# Patient Record
Sex: Female | Born: 1960 | Race: White | Hispanic: No | Marital: Married | Smoking: Never smoker
Health system: Southern US, Community
[De-identification: ages and names within clinical notes are randomized; demographics above are authoritative.]

## PROBLEM LIST (undated history)

## (undated) DIAGNOSIS — G259 Extrapyramidal and movement disorder, unspecified: Secondary | ICD-10-CM

## (undated) DIAGNOSIS — H539 Unspecified visual disturbance: Secondary | ICD-10-CM

## (undated) DIAGNOSIS — E785 Hyperlipidemia, unspecified: Secondary | ICD-10-CM

## (undated) DIAGNOSIS — F329 Major depressive disorder, single episode, unspecified: Secondary | ICD-10-CM

## (undated) DIAGNOSIS — F32A Depression, unspecified: Secondary | ICD-10-CM

## (undated) DIAGNOSIS — G35 Multiple sclerosis: Secondary | ICD-10-CM

## (undated) HISTORY — DX: Unspecified visual disturbance: H53.9

## (undated) HISTORY — PX: ABDOMINAL HYSTERECTOMY: SHX81

## (undated) HISTORY — DX: Extrapyramidal and movement disorder, unspecified: G25.9

---

## 2000-11-09 ENCOUNTER — Inpatient Hospital Stay (HOSPITAL_COMMUNITY): Admission: AD | Admit: 2000-11-09 | Discharge: 2000-11-09 | Payer: Self-pay | Admitting: Obstetrics & Gynecology

## 2010-11-08 DIAGNOSIS — N393 Stress incontinence (female) (male): Secondary | ICD-10-CM | POA: Insufficient documentation

## 2012-11-03 ENCOUNTER — Emergency Department (HOSPITAL_BASED_OUTPATIENT_CLINIC_OR_DEPARTMENT_OTHER)
Admission: EM | Admit: 2012-11-03 | Discharge: 2012-11-03 | Disposition: A | Discharge status: Home / Self Care | Payer: Managed Care, Other (non HMO) | Attending: Emergency Medicine | Admitting: Emergency Medicine

## 2012-11-03 ENCOUNTER — Encounter (HOSPITAL_BASED_OUTPATIENT_CLINIC_OR_DEPARTMENT_OTHER): Payer: Self-pay | Admitting: *Deleted

## 2012-11-03 ENCOUNTER — Emergency Department (HOSPITAL_BASED_OUTPATIENT_CLINIC_OR_DEPARTMENT_OTHER): Payer: Managed Care, Other (non HMO)

## 2012-11-03 DIAGNOSIS — Y929 Unspecified place or not applicable: Secondary | ICD-10-CM | POA: Insufficient documentation

## 2012-11-03 DIAGNOSIS — S5290XA Unspecified fracture of unspecified forearm, initial encounter for closed fracture: Secondary | ICD-10-CM

## 2012-11-03 DIAGNOSIS — G35 Multiple sclerosis: Secondary | ICD-10-CM | POA: Insufficient documentation

## 2012-11-03 DIAGNOSIS — E785 Hyperlipidemia, unspecified: Secondary | ICD-10-CM | POA: Insufficient documentation

## 2012-11-03 DIAGNOSIS — Y9301 Activity, walking, marching and hiking: Secondary | ICD-10-CM | POA: Insufficient documentation

## 2012-11-03 DIAGNOSIS — F3289 Other specified depressive episodes: Secondary | ICD-10-CM | POA: Insufficient documentation

## 2012-11-03 DIAGNOSIS — Z79899 Other long term (current) drug therapy: Secondary | ICD-10-CM | POA: Insufficient documentation

## 2012-11-03 DIAGNOSIS — F329 Major depressive disorder, single episode, unspecified: Secondary | ICD-10-CM | POA: Insufficient documentation

## 2012-11-03 DIAGNOSIS — W010XXA Fall on same level from slipping, tripping and stumbling without subsequent striking against object, initial encounter: Secondary | ICD-10-CM | POA: Insufficient documentation

## 2012-11-03 HISTORY — DX: Major depressive disorder, single episode, unspecified: F32.9

## 2012-11-03 HISTORY — DX: Hyperlipidemia, unspecified: E78.5

## 2012-11-03 HISTORY — DX: Multiple sclerosis: G35

## 2012-11-03 HISTORY — DX: Depression, unspecified: F32.A

## 2012-11-03 MED ORDER — HYDROCODONE-ACETAMINOPHEN 5-325 MG PO TABS: 2.0000 | ORAL_TABLET | ORAL | Status: DC | PRN

## 2012-11-03 NOTE — ED Notes (Signed)
Pt c/o right wrist injury x 3 days ago from fall

## 2012-11-03 NOTE — ED Provider Notes (Signed)
History     CSN: [id number]  Arrival date & time 11/03/12  1323   First MD Initiated Contact with Patient 11/03/12 1422      Chief Complaint  Patient presents with  . Wrist Pain    (Consider location/radiation/quality/duration/timing/severity/associated sxs/prior treatment) Patient is a 53 y.o. female presenting with wrist pain. The history is provided by the patient. No language interpreter was used.  Wrist Pain This is a new problem. Episode onset: 3 days ago. The problem occurs constantly. The problem has been unchanged. Associated symptoms include joint swelling and myalgias. Nothing aggravates the symptoms. She has tried nothing for the symptoms. The treatment provided no relief.  Pt fell 3 days ago and injured wrist.  Pt complains of swelling and pain to wrist and hand  Past Medical History  Diagnosis Date  . MS (multiple sclerosis)   . Depressed   . Hyperlipemia     Past Surgical History  Procedure Laterality Date  . Abdominal hysterectomy      History reviewed. No pertinent family history.  History  Substance Use Topics  . Smoking status: Never Smoker   . Smokeless tobacco: Not on file  . Alcohol Use: No    OB History   Grav Para Term Preterm Abortions TAB SAB Ect Mult Living                  Review of Systems  Musculoskeletal: Positive for myalgias and joint swelling.  All other systems reviewed and are negative.    Allergies  Latex  Home Medications   Current Outpatient Rx  Name  Route  Sig  Dispense  Refill  . FLUoxetine (PROZAC) 40 MG capsule   Oral   Take 40 mg by mouth daily.         Marland Kitchen lovastatin (MEVACOR) 10 MG tablet   Oral   Take 10 mg by mouth at bedtime.         Marland Kitchen oxybutynin (DITROPAN) 5 MG tablet   Oral   Take 5 mg by mouth 3 (three) times daily.           BP 105/74  Pulse 88  Temp(Src) 98.1 F (36.7 C) (Oral)  Resp 16  Ht 5' 7.5" (1.715 m)  Wt 145 lb (65.772 kg)  BMI 22.36 kg/m2  SpO2 100%  Physical Exam   Nursing note and vitals reviewed. Constitutional: She appears well-developed and well-nourished.  HENT:  Head: Normocephalic and atraumatic.  Musculoskeletal: She exhibits tenderness.  Swollen bruised right hand and wrist  Neurological: She is alert.  Skin: Skin is warm.  Psychiatric: She has a normal mood and affect.    ED Course  Procedures (including critical care time)  Labs Reviewed - No data to display Dg Wrist Complete Right  11/03/2012  *RADIOLOGY REPORT*  Clinical Data: Larey Seat.  Pain and swelling.  RIGHT WRIST - COMPLETE 3+ VIEW  Comparison: None.  Findings: There is a fracture of the radial metaphyseal region. Distal radial articular surface shows neutral tilt.  No fracture of the ulna.  No carpal fractures seen.  IMPRESSION: Transverse fracture of the distal radial metaphysis.  No pronounced angulation or any displacement.  Neutral tilt of the distal radial articular surface.   Original Report Authenticated By: Paulina Fusi, M.D.    Dg Hand Complete Right  11/03/2012  *RADIOLOGY REPORT*  Clinical Data: Fall.  Pain.  RIGHT HAND - COMPLETE 3+ VIEW  Comparison: None.  Findings: No fracture of the phalanges or, metacarpals or carpal  bones. There is a chronic appearing calcification adjacent to the metacarpal phalangeal joint of the long finger.  See wrist study for description of distal radial metaphyseal fracture.  IMPRESSION: No fracture in the hand.  Chronic appearing calcification at the MCP joint of the long finger.  Distal radial metaphyseal fracture.   Original Report Authenticated By: Paulina Fusi, M.D.      1. Fracture, radius     Pt counseled on fracture.   Pt advised to see Dr. Pearletha Forge this week for recheck.  MDM  Pt placed in a wrist splint.   rx for hydrocodone        Lonia Skinner [place], [place] 11/03/12 1454  Lonia Skinner [place], [place] 11/03/12 1456  Lonia Skinner [place], [place] 11/03/12 1505

## 2012-11-07 NOTE — ED Provider Notes (Signed)
Medical screening examination/treatment/procedure(s) were performed by non-physician practitioner and as supervising physician I was immediately available for consultation/collaboration.  Hurman Horn, MD 11/07/12 [phone]

## 2012-11-12 NOTE — ED Notes (Signed)
Adjusted ace wrap on sugar tong splint applied by me  on previous visit due to clothing had pulled a peace of ace off. Good cp refill. Advised pt to ware loose wrist shirts.

## 2013-02-28 DIAGNOSIS — E7800 Pure hypercholesterolemia, unspecified: Secondary | ICD-10-CM | POA: Insufficient documentation

## 2013-02-28 DIAGNOSIS — E039 Hypothyroidism, unspecified: Secondary | ICD-10-CM | POA: Insufficient documentation

## 2013-02-28 DIAGNOSIS — R42 Dizziness and giddiness: Secondary | ICD-10-CM | POA: Insufficient documentation

## 2013-02-28 DIAGNOSIS — F329 Major depressive disorder, single episode, unspecified: Secondary | ICD-10-CM | POA: Insufficient documentation

## 2013-02-28 DIAGNOSIS — R269 Unspecified abnormalities of gait and mobility: Secondary | ICD-10-CM | POA: Insufficient documentation

## 2013-06-10 DIAGNOSIS — G35 Multiple sclerosis: Secondary | ICD-10-CM | POA: Insufficient documentation

## 2013-06-10 DIAGNOSIS — F99 Mental disorder, not otherwise specified: Secondary | ICD-10-CM | POA: Insufficient documentation

## 2014-05-07 IMAGING — CR DG HAND COMPLETE 3+V*R*
3 series · 3 of 3 positions shown · non-contrast
Comparison: None.

CLINICAL DATA: Fall.  Pain.

RIGHT HAND - COMPLETE 3+ VIEW

[x hand pa right]
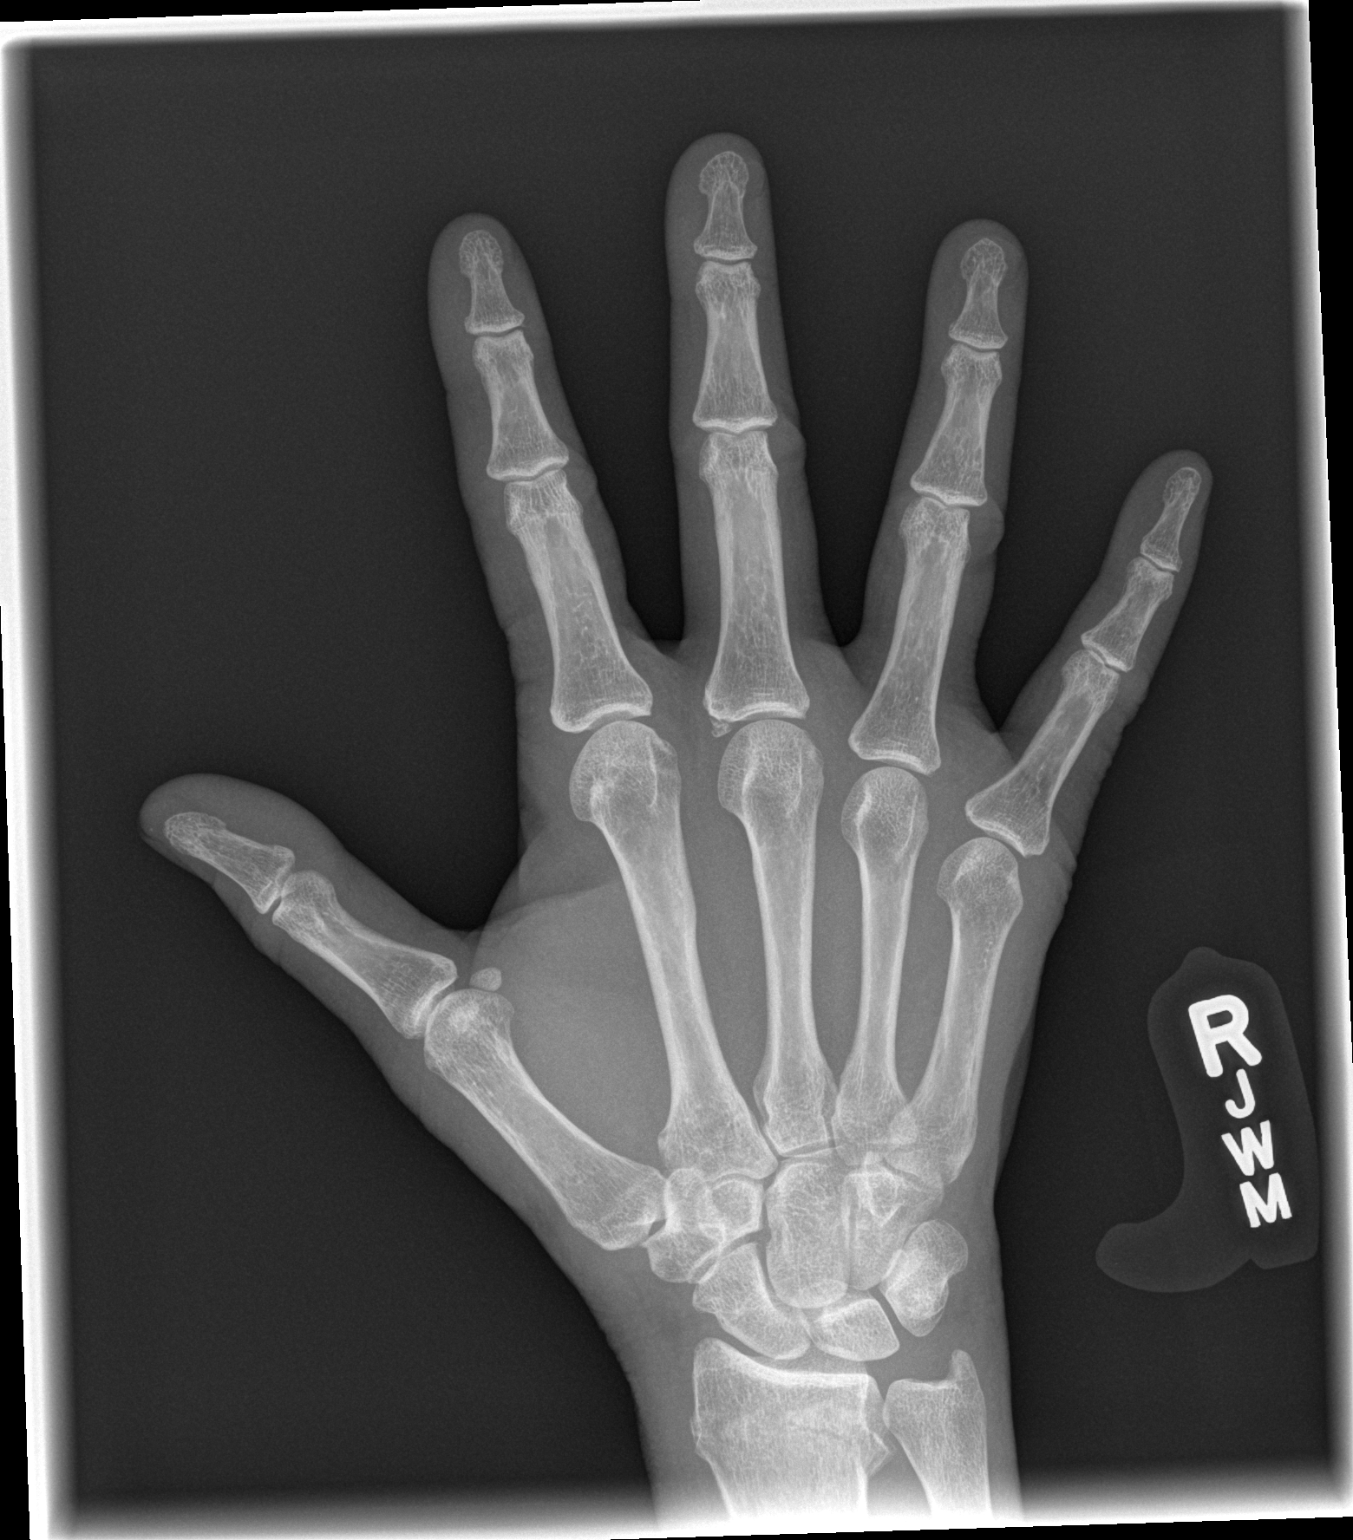

[x hand oblique right]
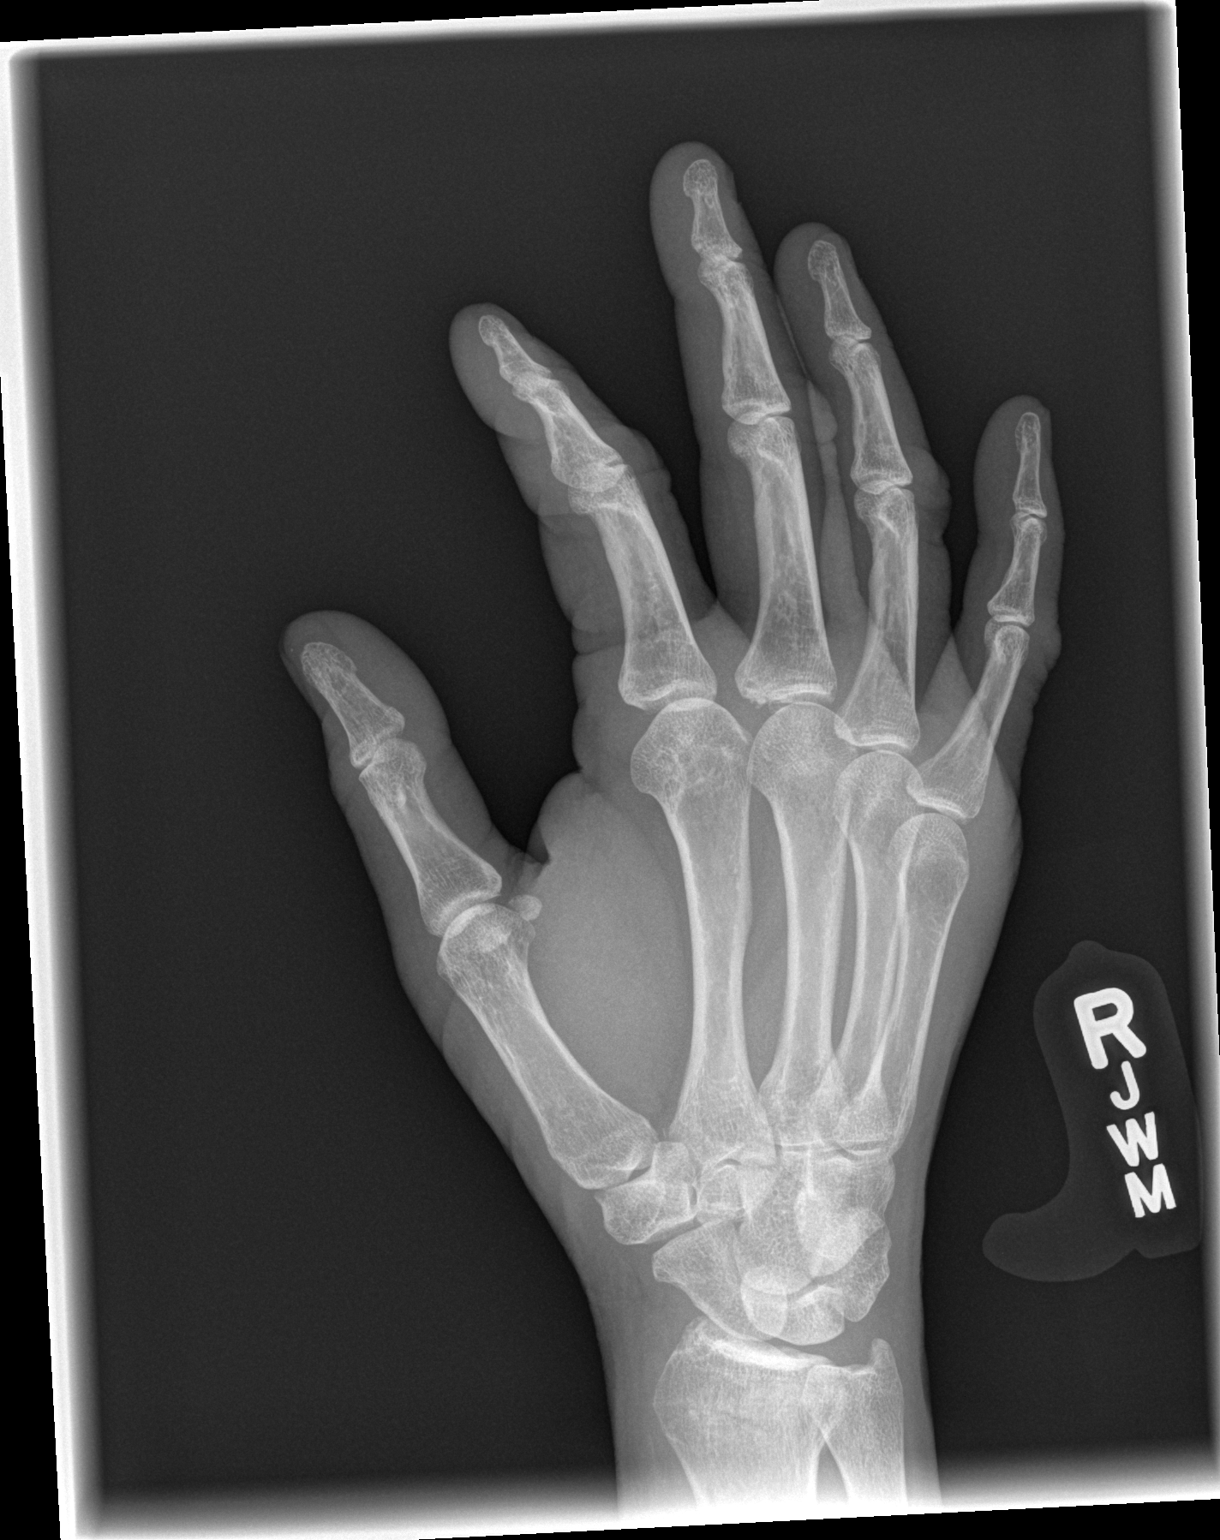

[x hand lat right]
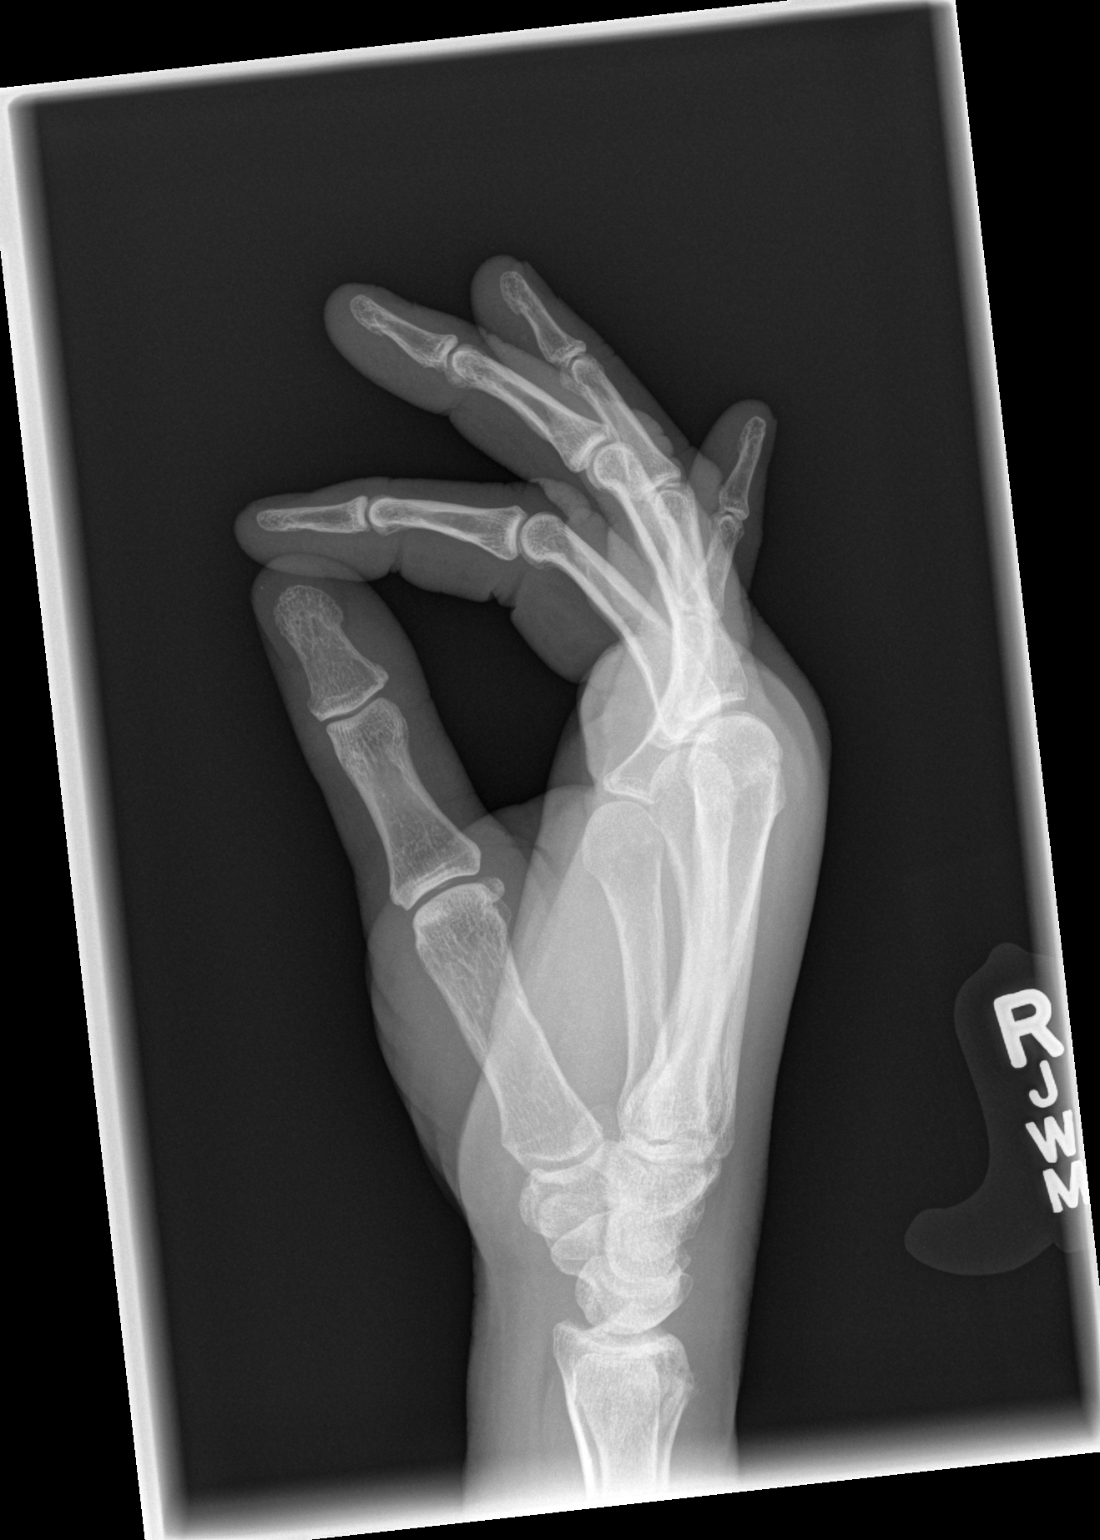

[3 of 3 positions shown; findings below may reference images not displayed]

FINDINGS: No fracture of the phalanges or, metacarpals or carpal
bones. There is a chronic appearing calcification adjacent to the
metacarpal phalangeal joint of the long finger.  See wrist study
for description of distal radial metaphyseal fracture.
IMPRESSION: No fracture in the hand.  Chronic appearing calcification at the
MCP joint of the long finger.  Distal radial metaphyseal fracture.

## 2014-06-10 DIAGNOSIS — Z1211 Encounter for screening for malignant neoplasm of colon: Secondary | ICD-10-CM | POA: Insufficient documentation

## 2014-06-10 DIAGNOSIS — N3942 Incontinence without sensory awareness: Secondary | ICD-10-CM | POA: Insufficient documentation

## 2014-11-11 ENCOUNTER — Telehealth: Payer: Self-pay | Admitting: Neurology

## 2014-11-11 NOTE — Telephone Encounter (Signed)
Noted.  Will discuss at ov appt/fim

## 2014-11-11 NOTE — Telephone Encounter (Signed)
Patient is calling to advise that her psychiatrist wrote her a Rx for Ritalin 20mg  but patient is cutting pill in half and only taking 10mg  which is working good for her and she is able to concentrate. Patient does have an appointment with Dr. Felecia Shelling on 3-31.

## 2014-12-07 ENCOUNTER — Telehealth: Payer: Self-pay | Admitting: Neurology

## 2014-12-07 NOTE — Telephone Encounter (Signed)
I checked with Faith, and it appears unfortunately, since the patient has not yet been seen at [facility], we do not have access to her medical records.  This clinical info is required to process PA.  I called [employer] back.  Spoke with Marylyn Ishihara.  He said they did have a previous auth on file that will be expiring. He will note info in patient's account that we called.  Says they do have another note on the account that they spoke with the patient, and may contact other office for info if needed prior to patient being seen here.

## 2014-12-07 NOTE — Telephone Encounter (Signed)
Dawn Keith with [facility] @ [phone], stated PA is needed for Rx GILENYA.  Patient scheduled to see Dr. Felecia Shelling on 12/23/14, may call in verbal PA to [phone].  Please call and advise.

## 2014-12-23 ENCOUNTER — Ambulatory Visit (INDEPENDENT_AMBULATORY_CARE_PROVIDER_SITE_OTHER): Payer: Managed Care, Other (non HMO) | Admitting: Neurology

## 2014-12-23 ENCOUNTER — Other Ambulatory Visit: Payer: Self-pay | Admitting: *Deleted

## 2014-12-23 ENCOUNTER — Encounter: Payer: Self-pay | Admitting: Neurology

## 2014-12-23 VITALS — BP 102/82 | HR 80 | Resp 14 | Ht 67.5 in | Wt 129.4 lb

## 2014-12-23 DIAGNOSIS — R269 Unspecified abnormalities of gait and mobility: Secondary | ICD-10-CM

## 2014-12-23 DIAGNOSIS — G4700 Insomnia, unspecified: Secondary | ICD-10-CM | POA: Diagnosis not present

## 2014-12-23 DIAGNOSIS — F329 Major depressive disorder, single episode, unspecified: Secondary | ICD-10-CM | POA: Diagnosis not present

## 2014-12-23 DIAGNOSIS — N393 Stress incontinence (female) (male): Secondary | ICD-10-CM | POA: Diagnosis not present

## 2014-12-23 DIAGNOSIS — R5383 Other fatigue: Secondary | ICD-10-CM | POA: Insufficient documentation

## 2014-12-23 DIAGNOSIS — F482 Pseudobulbar affect: Secondary | ICD-10-CM | POA: Insufficient documentation

## 2014-12-23 DIAGNOSIS — G35 Multiple sclerosis: Secondary | ICD-10-CM | POA: Diagnosis not present

## 2014-12-23 DIAGNOSIS — F32A Depression, unspecified: Secondary | ICD-10-CM

## 2014-12-23 MED ORDER — METHYLPHENIDATE HCL 20 MG PO TABS: ORAL_TABLET | ORAL | Status: DC

## 2014-12-23 MED ORDER — OXYBUTYNIN CHLORIDE 5 MG PO TABS: 5.0000 mg | ORAL_TABLET | Freq: Two times a day (BID) | ORAL | Status: DC

## 2014-12-23 MED ORDER — CITALOPRAM HYDROBROMIDE 40 MG PO TABS: 40.0000 mg | ORAL_TABLET | Freq: Every day | ORAL | Status: DC

## 2014-12-23 MED ORDER — FINGOLIMOD HCL 0.5 MG PO CAPS: 0.5000 mg | ORAL_CAPSULE | Freq: Every day | ORAL | Status: DC

## 2014-12-23 NOTE — Progress Notes (Signed)
[facility]  PATIENT: Dawn Keith DOB: 1961-03-01  REFERRING DOCTOR OR PCP:  None  SOURCE: patient  _________________________________   HISTORICAL  CHIEF COMPLAINT:  Chief Complaint  Patient presents with  . Multiple Sclerosis    Sts. she tolerates Gilenya well.  She is tearful today, sts. under more fiancial stress./fim    HISTORY OF PRESENT ILLNESS:  Dawn Keith is a 54 yo woman who was diagnosed with MS in 2012 after several years of worsening gait.      She started on Gilenya and has tolerated it well and has had no defiinite exacerbation but has worsened.   Her last MRI was last yeaer at [facility] in [facility].      Gait/Strength/Sensation:   She has difficulty with her gait and stumbles a lot but rarely falls.   Her right side seems weaker to her than the left.   She has no definite numbness or painful tingling.  Bladder:  She has frequency and urgency with incontinence daily.   She drips a lot and also has frank episodes of incontinece.   Poise pads have helped.   There is no hesitancy and she thinks she empties her bladder well. There have not been recent urinary tract infections.  Vision:   She reports no new visual symptoms.     She is scheduled to see ophthalmology soon for her annual exam.  Fatigue/sleep:   She has cognitive and physical fatigue.   Ritalin has helped and she is on 10 mg in the morning and 10 mg at noon.   She reports insomnia with difficulty falling asleep and staying asleep.   She notes that if she wakes up after a few hours of sleep that she may not be on fall back asleep again.  Mood/Cognition:   Mood issues have worsened for her. She has depression and is often tearful.    Nuedexta was recently started she thinks it helped her some. She stopped fluoxetine because a friend told her that should her dose (60 mg) was too high. I discussed with her that that dose is not that unusual but she prefers to use a different  medication..  She remains on buspirone.    She thinks she gets a benefit from it at bid.    She is tearful multiple times a the visit today and is very stressed with her illness and financial issues.      REVIEW OF SYSTEMS: Constitutional: No fevers, chills, sweats, or change in appetite.  Notes fatigue Eyes: No visual changes, double vision, eye pain Ear, nose and throat: No hearing loss, ear pain, nasal congestion, sore throat Cardiovascular: No chest pain, palpitations Respiratory: No shortness of breath at rest or with exertion.   No wheezes GastrointestinaI: No nausea, vomiting, diarrhea, abdominal pain, fecal incontinence Genitourinary: as above Musculoskeletal: No neck pain, back pain Integumentary: No rash, pruritus, skin lesions Neurological: as above Psychiatric: as aove Endocrine: No palpitations, diaphoresis, change in appetite, change in weigh or increased thirst Hematologic/Lymphatic: No anemia, purpura, petechiae. Allergic/Immunologic: No itchy/runny eyes, nasal congestion, recent allergic reactions, rashes  ALLERGIES: Allergies  Allergen Reactions  . Donepezil     "makes me mean"  . Latex     HOME MEDICATIONS:  Current outpatient prescriptions:  .  busPIRone (BUSPAR) 15 MG tablet, Take 15 mg by mouth., Disp: , Rfl:  .  Cholecalciferol 5000 UNITS TABS, Take by mouth., Disp: , Rfl:  .  DOCOSAHEXAENOIC ACID PO, Take by  mouth., Disp: , Rfl:  .  Fingolimod HCl 0.5 MG CAPS, Take 0.5 mg by mouth daily., Disp: , Rfl:  .  FLUoxetine (PROZAC) 40 MG capsule, Take 40 mg by mouth daily., Disp: , Rfl:  .  HYDROcodone-acetaminophen (NORCO/VICODIN) 5-325 MG per tablet, Take 2 tablets by mouth every 4 (four) hours as needed for pain., Disp: 20 tablet, Rfl: 0 .  lamoTRIgine (LAMICTAL) 25 MG tablet, , Disp: , Rfl:  .  levothyroxine (SYNTHROID, LEVOTHROID) 75 MCG tablet, , Disp: , Rfl:  .  lovastatin (MEVACOR) 10 MG tablet, Take 10 mg by mouth at bedtime., Disp: , Rfl:  .   methylphenidate (RITALIN) 20 MG tablet, Take 20 mg by mouth. Take one tablet in the morning and 1/2 tablet at noon, Disp: , Rfl:  .  Naproxen Sodium 220 MG CAPS, Take by mouth., Disp: , Rfl:  .  NUEDEXTA 20-10 MG CAPS, Take 20 mg by mouth 2 (two) times daily., Disp: , Rfl:  .  oxybutynin (DITROPAN) 5 MG tablet, Take 5 mg by mouth 3 (three) times daily., Disp: , Rfl:  .  solifenacin (VESICARE) 10 MG tablet, Take 10 mg by mouth., Disp: , Rfl:  .  vitamin B-12 (CYANOCOBALAMIN) 1000 MCG tablet, Take by mouth., Disp: , Rfl:   PAST MEDICAL HISTORY: Past Medical History  Diagnosis Date  . MS (multiple sclerosis)   . Depressed   . Hyperlipemia   . Movement disorder   . Vision abnormalities     PAST SURGICAL HISTORY: Past Surgical History  Procedure Laterality Date  . Abdominal hysterectomy      FAMILY HISTORY: History reviewed. No pertinent family history.  SOCIAL HISTORY:  History   Social History  . Marital Status: Married    Spouse Name: N/A  . Number of Children: N/A  . Years of Education: N/A   Occupational History  . Not on file.   Social History Main Topics  . Smoking status: Never Smoker   . Smokeless tobacco: Not on file  . Alcohol Use: No  . Drug Use: No  . Sexual Activity: No   Other Topics Concern  . Not on file   Social History Narrative     PHYSICAL EXAM  Filed Vitals:   12/23/14 1316  BP: 102/82  Pulse: 80  Resp: 14  Height: 5' 7.5" (1.715 m)  Weight: 129 lb 6.4 oz (58.695 kg)    Body mass index is 19.96 kg/(m^2).   General: The patient is well-developed and well-nourished and in no acute distress  Eyes:  Funduscopic exam shows normal optic discs and retinal vessels.  Neck: The neck is supple, no carotid bruits are noted.  The neck is nontender.  Cardiovascular: The heart has a regular rate and rhythm with a normal S1 and S2. There were no murmurs, gallops or rubs. Lungs are clear to auscultation.  Skin: Extremities are without  significant edema.  Musculoskeletal:  Back is nontender  Neurologic Exam  Mental status: The patient is alert and oriented x 3 at the time of the examination. There is some flight of ideas and she needs constant redirection. She is tearful.   Memory and attention appear to be normal and baseline for her.    Speech is normal.  Cranial nerves: Extraocular movements are full. Pupils are equal, round, and reactive to light and accomodation.  Visual fields are full.  Facial symmetry is present. There is good facial sensation to soft touch bilaterally.Facial strength is normal.  Trapezius and sternocleidomastoid  strength is normal. No dysarthria is noted.  The tongue is midline, and the patient has symmetric elevation of the soft palate. No obvious hearing deficits are noted.  Motor:  Muscle bulk is normal.   Tone is slightly increased in legs.. Strength is  5 / 5 in all 4 extremities.   Sensory: Sensory testing is intact to pinprick, soft touch and vibration sensation in all 4 extremities.  Coordination: Cerebellar testing reveals good finger-nose-finger and heel-to-shin bilaterally.  Gait and station: Station is normal.   Gait is wide. Tandem gait is poor. Romberg is negative.   Reflexes: Deep tendon reflexes are increased on both legs.   Plantar responses are flexor.    DIAGNOSTIC DATA (LABS, IMAGING, TESTING) - I reviewed patient records, labs, notes, testing and imaging myself where available.     ASSESSMENT AND PLAN  DS (disseminated sclerosis)  Clinical depression  Abnormal gait  Female genuine stress incontinence  Pseudobulbar affect  Other fatigue  Insomnia   In summary, Dawn Keith is a 54 year old woman with multiple sclerosis who has many neurologic symptoms including poor gait, bladder dysfunction, depression was pseudobulbar affect her mood disturbances appear to be the most bothersome to her at this point. She is very tearful and I discussed with her that I think  she needs to get back on an antidepressant. She stopped fluoxetine because a friend told her that her dose was too high. I think she would benefit from an SSRI and a placed on citalopram.   The concern about QT prolongation and interaction with Gilenya is not a concern after the first couple weeks of being on Gilenya. She is very concerned about money and I'm trying to make sure that she is placed on a $4 drug. I also printed out a coupon to that she can get the oxybutynin for discount.   I gave her some samples of Nuedexta and a savings card.   I refilled her Ritalin. I recommend that she continue to follow-up with psychiatry as she does appear to be doing worse than she was doing last year. Her MS is fairly stable and she has not had any definite exacerbation while on Gilenya. She tolerates it well. We will probably check another MRI later this year to rule out subclinical progression.  She will return to see me in 4 months or sooner if she has new or worsening neurologic symptoms.  45 minute face-to-face interaction with greater than one half of the time counseling and coordinating care about her MS and psychiatric issues.   Richard A. Felecia Shelling, MD, PhD 02/26/5408, 8:11 PM Certified in Neurology, Clinical Neurophysiology, Sleep Medicine, Pain Medicine and Neuroimaging  [facility] [address] [place], [place] [address] [phone]

## 2014-12-27 ENCOUNTER — Telehealth: Payer: Self-pay

## 2014-12-27 ENCOUNTER — Telehealth: Payer: Self-pay | Admitting: Neurology

## 2014-12-27 NOTE — Telephone Encounter (Signed)
Patient is calling to let you know the new Rx Nuedexta 20-10mg  is doing really well for her and to let you know she appreciates you! Thanks!

## 2014-12-27 NOTE — Telephone Encounter (Signed)
Dawn Keith has approved the request for coverage on Gilenya effective until 12/26/2015 Ref # [id number]

## 2014-12-27 NOTE — Telephone Encounter (Signed)
Noted/fim 

## 2014-12-28 ENCOUNTER — Telehealth: Payer: Self-pay | Admitting: *Deleted

## 2014-12-28 NOTE — Telephone Encounter (Signed)
Release fax to [facility] on 12/28/14.

## 2014-12-29 ENCOUNTER — Telehealth: Payer: Self-pay | Admitting: Neurology

## 2014-12-29 NOTE — Telephone Encounter (Signed)
Spoke with Snigdha and advised RAS has signed handicap placard application and I have mailed this to her home address today. She verbalized understanding of same/fim

## 2014-12-29 NOTE — Telephone Encounter (Signed)
Patient stated she needs 1 additional [place].  Please call and advise.

## 2015-02-22 ENCOUNTER — Telehealth: Payer: Self-pay | Admitting: Neurology

## 2015-02-22 NOTE — Telephone Encounter (Signed)
Patient called and requested to speak with Faith RN regarding questions she as about Rx. methylphenidate (RITALIN) 20 MG tablet. Please call and advise.

## 2015-02-22 NOTE — Telephone Encounter (Signed)
I have spoken with Dawn Keith this afternoon.  She simply wanted to review her medications and what they are for.  We reviewd that Buspar and Celexa are for depression, Nuedexta is to help with emotional outbursts, Ritalin is for fatigue.  She also asked about Donepezil.  We discussed that she has taken this in the past for memory loss, but that it didn't seem to help her very much; that she seems to get more benefit from Ritalin, antidepressants, and Nuedexta.  Camil verbalized understanding of same/fim

## 2015-04-06 ENCOUNTER — Telehealth: Payer: Self-pay | Admitting: Neurology

## 2015-04-06 NOTE — Telephone Encounter (Signed)
I have spoken with Dawn Keith this morning and per her request rescheduled her 7-18 appt to September, due to copay and trnsportation issues. I also answered med questions regarding Nuedexta./fim

## 2015-04-06 NOTE — Telephone Encounter (Signed)
Patient called and requested to speak with Faith RN regarding her up coming apt and some questions she has about her medication. Please call and advise.

## 2015-04-11 ENCOUNTER — Ambulatory Visit: Payer: Managed Care, Other (non HMO) | Admitting: Neurology

## 2015-06-07 ENCOUNTER — Ambulatory Visit (INDEPENDENT_AMBULATORY_CARE_PROVIDER_SITE_OTHER): Payer: Managed Care, Other (non HMO) | Admitting: Neurology

## 2015-06-07 ENCOUNTER — Encounter: Payer: Self-pay | Admitting: Neurology

## 2015-06-07 VITALS — BP 126/76 | HR 70 | Resp 16 | Ht 67.5 in | Wt 127.2 lb

## 2015-06-07 DIAGNOSIS — F482 Pseudobulbar affect: Secondary | ICD-10-CM | POA: Diagnosis not present

## 2015-06-07 DIAGNOSIS — G4700 Insomnia, unspecified: Secondary | ICD-10-CM

## 2015-06-07 DIAGNOSIS — F09 Unspecified mental disorder due to known physiological condition: Secondary | ICD-10-CM

## 2015-06-07 DIAGNOSIS — R5383 Other fatigue: Secondary | ICD-10-CM

## 2015-06-07 DIAGNOSIS — F99 Mental disorder, not otherwise specified: Secondary | ICD-10-CM

## 2015-06-07 DIAGNOSIS — F329 Major depressive disorder, single episode, unspecified: Secondary | ICD-10-CM

## 2015-06-07 DIAGNOSIS — G35 Multiple sclerosis: Secondary | ICD-10-CM | POA: Diagnosis not present

## 2015-06-07 DIAGNOSIS — N3942 Incontinence without sensory awareness: Secondary | ICD-10-CM | POA: Diagnosis not present

## 2015-06-07 DIAGNOSIS — R269 Unspecified abnormalities of gait and mobility: Secondary | ICD-10-CM | POA: Diagnosis not present

## 2015-06-07 DIAGNOSIS — F32A Depression, unspecified: Secondary | ICD-10-CM

## 2015-06-07 MED ORDER — METHYLPHENIDATE HCL 20 MG PO TABS: ORAL_TABLET | ORAL | Status: DC

## 2015-06-07 MED ORDER — B-12 2500 MCG PO TABS: 2500.0000 ug | ORAL_TABLET | Freq: Every day | ORAL | Status: AC

## 2015-06-07 MED ORDER — CITALOPRAM HYDROBROMIDE 40 MG PO TABS: 40.0000 mg | ORAL_TABLET | Freq: Every day | ORAL | Status: DC

## 2015-06-07 MED ORDER — BUSPIRONE HCL 15 MG PO TABS: 15.0000 mg | ORAL_TABLET | Freq: Two times a day (BID) | ORAL | Status: DC

## 2015-06-07 MED ORDER — NUEDEXTA 20-10 MG PO CAPS: 20.0000 mg | ORAL_CAPSULE | Freq: Two times a day (BID) | ORAL | Status: DC

## 2015-06-07 NOTE — Patient Instructions (Signed)
Call the [facility] to find out more about Medicare [phone]

## 2015-06-07 NOTE — Progress Notes (Signed)
[facility]  PATIENT: Dawn Keith DOB: [birth year]  REFERRING DOCTOR OR PCP:  None  SOURCE: patient  _________________________________   HISTORICAL  CHIEF COMPLAINT:  Chief Complaint  Patient presents with  . Multiple Sclerosis    Sts. she continues to tolerate Gilenya well.  At last ov, Citalopram was added for depression.  She is out of this, is not sure if it helped or not. Sts. she recently saw her psychiatrist, that no changes were made to her tx regimen, and she has a f/u app. with psych in 3 mos.  Sts. she is not taking Nudexta and Buspar for financial reasons./fim    HISTORY OF PRESENT ILLNESS:  Dawn Keith is a 54 yo woman with MS who has had worsening gait and a lot more difficulty with mood over the past year.  She is on Gilenya and tolerates it well.   She has not had definite exacerbations but feels she is doing worse.     Due to copays, she has trouble seeing Psychiatry.     MS history:  She was diagnosed with MS in 2012 after several years of worsening gait.      She started on Gilenya and has tolerated it well and has had no defiinite exacerbation but has worsened.   Her last MRI was last year at [facility] in [facility].      Gait/Strength/Sensation:   She has difficulty with her gait.  She stumbles a lot but rarely falls.   Her right side i  weaker to her than the left.   She has no definite numbness or painful tingling.   She feels weaker in hot weather.   She was able to climb up to do her job [employer]or, [employer]) last year but now can't do this.    Bladder:  She has frequency and urgency with incontinence daily.   She drips a lot and also has frank episodes of incontinece.   Poise pads have helped.   There is no hesitancy and she thinks she empties her bladder well. There have not been recent urinary tract infections.  Vision:   She reports no new visual symptoms.     She is scheduled to see ophthalmology soon for her annual  exam.  Fatigue/sleep:   She has cognitive and physical fatigue.   Ritalin has helped and she is on 10 mg in the morning and 10 mg at noon.   She has had insomnia with difficulty falling asleep and staying asleep.  She worries a lot about the next day every night.    If she wakes up after a few hours of sleep that she may not be on fall back asleep again.  Mood/Cognition:   Mood issues have worsened a lot over the last year and she is tearful and stressed.   She has depression and is often tearful.  Celexa may have helped   Nuedexta was recently started she thinks it helped her some.   She thinks she got a benefit from celexa.    She is tearful multiple times a the visit today and is very stressed with her illness and financial issues.      REVIEW OF SYSTEMS: Constitutional: No fevers, chills, sweats, or change in appetite.  Notes fatigue Eyes: No visual changes, double vision, eye pain Ear, nose and throat: No hearing loss, ear pain, nasal congestion, sore throat Cardiovascular: No chest pain, palpitations Respiratory: No shortness of breath at rest or with exertion.  No wheezes GastrointestinaI: No nausea, vomiting, diarrhea, abdominal pain, fecal incontinence Genitourinary: as above Musculoskeletal: No neck pain, back pain Integumentary: No rash, pruritus, skin lesions Neurological: as above Psychiatric: as aove Endocrine: No palpitations, diaphoresis, change in appetite, change in weigh or increased thirst Hematologic/Lymphatic: No anemia, purpura, petechiae. Allergic/Immunologic: No itchy/runny eyes, nasal congestion, recent allergic reactions, rashes  ALLERGIES: Allergies  Allergen Reactions  . Donepezil     "makes me mean"  . Latex     HOME MEDICATIONS:  Current outpatient prescriptions:  .  Cholecalciferol 5000 UNITS TABS, Take by mouth., Disp: , Rfl:  .  Fingolimod HCl 0.5 MG CAPS, Take 1 capsule (0.5 mg total) by mouth daily., Disp: 30 capsule, Rfl: 11 .  lamoTRIgine  (LAMICTAL) 25 MG tablet, , Disp: , Rfl:  .  levothyroxine (SYNTHROID, LEVOTHROID) 75 MCG tablet, , Disp: , Rfl:  .  lovastatin (MEVACOR) 10 MG tablet, Take 10 mg by mouth at bedtime., Disp: , Rfl:  .  methylphenidate (RITALIN) 20 MG tablet, Take one tablet in the morning and 1/2 tablet at noon, Disp: 45 tablet, Rfl: 0 .  oxybutynin (DITROPAN) 5 MG tablet, Take 1 tablet (5 mg total) by mouth 2 (two) times daily., Disp: 60 tablet, Rfl: 11 .  busPIRone (BUSPAR) 15 MG tablet, Take 15 mg by mouth., Disp: , Rfl:  .  citalopram (CELEXA) 40 MG tablet, Take 1 tablet (40 mg total) by mouth daily. (Patient not taking: Reported on 06/07/2015), Disp: 30 tablet, Rfl: 11 .  DOCOSAHEXAENOIC ACID PO, Take by mouth., Disp: , Rfl:  .  HYDROcodone-acetaminophen (NORCO/VICODIN) 5-325 MG per tablet, Take 2 tablets by mouth every 4 (four) hours as needed for pain. (Patient not taking: Reported on 06/07/2015), Disp: 20 tablet, Rfl: 0 .  Naproxen Sodium 220 MG CAPS, Take by mouth., Disp: , Rfl:  .  NUEDEXTA 20-10 MG CAPS, Take 20 mg by mouth 2 (two) times daily., Disp: , Rfl:  .  vitamin B-12 (CYANOCOBALAMIN) 1000 MCG tablet, Take by mouth., Disp: , Rfl:   PAST MEDICAL HISTORY: Past Medical History  Diagnosis Date  . MS (multiple sclerosis)   . Depressed   . Hyperlipemia   . Movement disorder   . Vision abnormalities     PAST SURGICAL HISTORY: Past Surgical History  Procedure Laterality Date  . Abdominal hysterectomy      FAMILY HISTORY: History reviewed. No pertinent family history.  SOCIAL HISTORY:  Social History   Social History  . Marital Status: Married    Spouse Name: N/A  . Number of Children: N/A  . Years of Education: N/A   Occupational History  . Not on file.   Social History Main Topics  . Smoking status: Never Smoker   . Smokeless tobacco: Not on file  . Alcohol Use: No  . Drug Use: No  . Sexual Activity: No   Other Topics Concern  . Not on file   Social History Narrative      PHYSICAL EXAM  Filed Vitals:   06/07/15 1527  BP: 126/76  Pulse: 70  Resp: 16  Height: 5' 7.5" (1.715 m)  Weight: 127 lb 3.2 oz (57.698 kg)    Body mass index is 19.62 kg/(m^2).   General: The patient is well-developed and well-nourished and in no acute distress.     Eyes:  Funduscopic exam shows normal optic discs and retinal vessels.  Neck: The neck is supple, no carotid bruits are noted.  The neck is nontender.  Cardiovascular: The  heart has a regular rate and rhythm with a normal S1 and S2. There were no murmurs, gallops or rubs. Lungs are clear to auscultation.  Skin: Extremities are without significant edema.  Musculoskeletal:  Back is nontender  Neurologic Exam  Mental status: The patient is alert and oriented x 3 at the time of the examination. She cried on several occasions and also struck out at her mom a few times and had pressured speech at times. She was very distractible   There is some flight of ideas and she needs constant redirection.   Memory and attention appear to be normal and baseline for her.    Speech is normal.  Cranial nerves: Extraocular movements are full.    Facial symmetry is present. There is good facial sensation to soft touch bilaterally.Facial strength is normal.  Trapezius and sternocleidomastoid strength is normal. No dysarthria is noted.    No obvious hearing deficits are noted.  Motor:  Muscle bulk is normal.   Tone is slightly increased in legs.. Strength is  5 / 5 in all 4 extremities.   Sensory: Sensory testing is intact to soft touch and vibration sensation in all 4 extremities.  Coordination: Cerebellar testing reveals good finger-nose-finger and reduced heel-to-shin in right leg.  Gait and station: Station is normal.   Gait is wide but she can walk without her cane. She can't tandem walk. Romberg is negative.   Reflexes: Deep tendon reflexes are increased on both legs.   Plantar responses are flexor.    DIAGNOSTIC DATA  (LABS, IMAGING, TESTING) - I reviewed patient records, labs, notes, testing and imaging myself where available.     ASSESSMENT AND PLAN  DS (disseminated sclerosis)  Organic brain syndrome, psychotic  Abnormal gait  Clinical depression  Other fatigue  Insomnia  Pseudobulbar affect  Urinary incontinence without sensory awareness   1.   I refilled her buspirone and citalopram and Nuedexta. I asked her to please let us know if she runs out in the future so she does not go without medication. I strongly recommended that she follow-up with psychiatry due to her severe agitated depression.    2.   I reviewed in detail all of her medications so that she knew what to take and when. 3.   Methylphenidate was renewed. 4.   She is advised to stay active and to use her cane or walker to help prevent falls. 5.   Return in 5 months or sooner if there are new or worsening neurologic symptoms.  45 minute face-to-face interaction with greater than one half of the time counseling and coordinating care about her MS and psychiatric issues.   Mcdonald Reiling A. Felecia Shelling, MD, PhD 5/80/9983, 3:82 PM Certified in Neurology, Clinical Neurophysiology, Sleep Medicine, Pain Medicine and Neuroimaging  [facility] [address], [place], [place] [address] [phone]

## 2015-06-09 ENCOUNTER — Ambulatory Visit: Payer: Self-pay | Admitting: Neurology

## 2015-07-14 ENCOUNTER — Telehealth: Payer: Self-pay | Admitting: Neurology

## 2015-07-14 NOTE — Telephone Encounter (Signed)
Patient is calling. The patient has questions about medications citalopram (CELEXA) 40 MG tablet and busPIRone (BUSPAR) 15 MG tablet. Please call and discuss. Thank you.

## 2015-07-14 NOTE — Telephone Encounter (Signed)
I have spoken with Dawn Keith, provided reassurance and answered questions about meds.  (She just wanted to know what Celexa and Buspar are for and I advised depression/anxiety).Hilton Cork

## 2015-07-25 ENCOUNTER — Telehealth: Payer: Self-pay | Admitting: Neurology

## 2015-07-25 ENCOUNTER — Other Ambulatory Visit: Payer: Self-pay | Admitting: Neurology

## 2015-07-25 MED ORDER — METHYLPHENIDATE HCL 20 MG PO TABS: ORAL_TABLET | ORAL | Status: DC

## 2015-07-25 NOTE — Telephone Encounter (Signed)
Patient called requesting for Faith to call back regarding methylphenidate (RITALIN) 20 MG tablet. Husband is at pharmacy now and Rx has expired. They need paper Rx. Call patient [phone].

## 2015-07-25 NOTE — Telephone Encounter (Signed)
Per RAS, ok for 2 postdated rx's.  Rx's printed, signed, up front [facility].  I have spoken with Dawn Keith and made her aware rx's are ready to be picked up/fim

## 2015-07-25 NOTE — Telephone Encounter (Signed)
Patient called requesting that Rx for methylphenidate (RITALIN) 20 MG tablet go ahead and be written for November and December so she doesn't have to go through what she went through this time.

## 2015-07-25 NOTE — Telephone Encounter (Signed)
Pt called sts she needs refill formethylphenidate (RITALIN) 20 MG tablet ,she began to cry and said she is a prisoner in her own home. She said her husband controls everything, she has no car, no gas, if someone calls he hears the phone. She sts he controls their daughter also. She sts cannot call while he's at home, he will hear and question her. (documentation only)

## 2015-07-25 NOTE — Telephone Encounter (Signed)
I have spoken with Dawn Keith this afternoon, and advised that Ritalin rx. has been printed, signed, is up front [facility] for her to pick up.  She verbalized understanding of same/fim

## 2015-11-01 ENCOUNTER — Telehealth: Payer: Self-pay | Admitting: Neurology

## 2015-11-01 MED ORDER — NUEDEXTA 20-10 MG PO CAPS: 20.0000 mg | ORAL_CAPSULE | Freq: Two times a day (BID) | ORAL | Status: DC

## 2015-11-01 NOTE — Telephone Encounter (Signed)
error 

## 2015-11-01 NOTE — Telephone Encounter (Signed)
Pt called requesting refill for NUEDEXTA 20-10 MG CAPS to [facility] on [address]

## 2015-11-08 ENCOUNTER — Telehealth: Payer: Self-pay | Admitting: Neurology

## 2015-11-08 ENCOUNTER — Ambulatory Visit (INDEPENDENT_AMBULATORY_CARE_PROVIDER_SITE_OTHER): Payer: Managed Care, Other (non HMO) | Admitting: Neurology

## 2015-11-08 ENCOUNTER — Encounter: Payer: Self-pay | Admitting: Neurology

## 2015-11-08 VITALS — BP 116/68 | HR 72 | Resp 16 | Ht 67.5 in | Wt 125.0 lb

## 2015-11-08 DIAGNOSIS — F09 Unspecified mental disorder due to known physiological condition: Secondary | ICD-10-CM

## 2015-11-08 DIAGNOSIS — R269 Unspecified abnormalities of gait and mobility: Secondary | ICD-10-CM

## 2015-11-08 DIAGNOSIS — F99 Mental disorder, not otherwise specified: Secondary | ICD-10-CM

## 2015-11-08 DIAGNOSIS — F482 Pseudobulbar affect: Secondary | ICD-10-CM | POA: Diagnosis not present

## 2015-11-08 DIAGNOSIS — F32A Depression, unspecified: Secondary | ICD-10-CM

## 2015-11-08 DIAGNOSIS — G35 Multiple sclerosis: Secondary | ICD-10-CM | POA: Diagnosis not present

## 2015-11-08 DIAGNOSIS — F329 Major depressive disorder, single episode, unspecified: Secondary | ICD-10-CM

## 2015-11-08 DIAGNOSIS — G4700 Insomnia, unspecified: Secondary | ICD-10-CM

## 2015-11-08 DIAGNOSIS — R5383 Other fatigue: Secondary | ICD-10-CM

## 2015-11-08 MED ORDER — METHYLPHENIDATE HCL 20 MG PO TABS: ORAL_TABLET | ORAL | Status: DC

## 2015-11-08 NOTE — Telephone Encounter (Signed)
Pt called she forgot to ask about RX for Gilenya. She takes this everyday. She is inquiring if it needs PA.

## 2015-11-08 NOTE — Progress Notes (Signed)
[facility]  PATIENT: Dawn Keith DOB: 1961-03-13  REFERRING DOCTOR OR PCP:  None  SOURCE: patient  _________________________________   HISTORICAL  CHIEF COMPLAINT:  Chief Complaint  Patient presents with  . Multiple Sclerosis    Sts. she continues to tolerate Gilenya well.  Denies missed doses.  Sts. she is taking Nuedexta and believes this helps.  She continues to c/o cognitive disabilities and the negative effect they have on her family life--relationship with her mother, husband and dtr/fim    HISTORY OF PRESENT ILLNESS:  Dawn Keith is a 55 yo woman with MS.    She feels her MS has been mostly stable but she gets frustrated easily.     She is on Gilenya and tolerates it well.   She has not had definite exacerbations but continues to feel poorly.    Gait/Strength/Sensation:   She has difficulty with her gait.  She stumbles a lot and sometimes falls.  She has had a couple falls that led to injuries.     Her right side is  weaker than the left.   She has no definite numbness or painful tingling.   She feels weaker in hot weather.   She was able to climb a ladder (though not well) in 2015.    Bladder:  She has frequency and urgency and was having incontinence.    Oxybutynin was started and has helped.   There is no hesitancy and she thinks she empties her bladder well. There have not been recent urinary tract infections.  Vision:   She reports no new visual symptoms.     She is scheduled to see ophthalmology soon for her annual exam.  Fatigue/sleep:   She has cognitive and physical fatigue.   Ritalin has helped.  She is on 10 mg in the morning and 10 mg at noon.   She has had insomnia with difficulty falling asleep and staying asleep.  She worries a lot about the next day and family issues.    If she wakes up after a few hours of sleep that she may not be on fall back asleep again.  Mood/Cognition:   Mood issues have worsened a lot over the last year and she is  tearful and stressed.   She has depression and is often tearful.  Celexa may have helped   Nuedexta was recently started she thinks it helped her some.   She thinks she got a benefit from celexa.    She is tearful multiple times a the visit today and is very stressed with her illness and financial issues.      MS history:  She was diagnosed with MS in 2012 after several years of worsening gait.      She started on Gilenya and has tolerated it well and has had no defiinite exacerbation but has worsened.   Her last MRI was 2015 at [facility].        REVIEW OF SYSTEMS: Constitutional: No fevers, chills, sweats, or change in appetite.  Notes fatigue Eyes: No visual changes, double vision, eye pain Ear, nose and throat: No hearing loss, ear pain, nasal congestion, sore throat Cardiovascular: No chest pain, palpitations Respiratory: No shortness of breath at rest or with exertion.   No wheezes GastrointestinaI: No nausea, vomiting, diarrhea, abdominal pain, fecal incontinence Genitourinary: as above Musculoskeletal: No neck pain, back pain Integumentary: No rash, pruritus, skin lesions Neurological: as above Psychiatric: as aove Endocrine: No palpitations, diaphoresis,  change in appetite, change in weigh or increased thirst Hematologic/Lymphatic: No anemia, purpura, petechiae. Allergic/Immunologic: No itchy/runny eyes, nasal congestion, recent allergic reactions, rashes  ALLERGIES: Allergies  Allergen Reactions  . Donepezil     "makes me mean"  . Latex     HOME MEDICATIONS:  Current outpatient prescriptions:  .  busPIRone (BUSPAR) 15 MG tablet, Take 1 tablet (15 mg total) by mouth 2 (two) times daily., Disp: 60 tablet, Rfl: 11 .  Cholecalciferol 5000 UNITS TABS, Take by mouth., Disp: , Rfl:  .  citalopram (CELEXA) 40 MG tablet, Take 1 tablet (40 mg total) by mouth daily., Disp: 30 tablet, Rfl: 11 .  Cyanocobalamin (VITAMIN B-12) 2500 MCG TABS, Take 2,500 mcg  by mouth daily., Disp: 30 tablet, Rfl: 5 .  DOCOSAHEXAENOIC ACID PO, Take by mouth., Disp: , Rfl:  .  Fingolimod HCl 0.5 MG CAPS, Take 1 capsule (0.5 mg total) by mouth daily., Disp: 30 capsule, Rfl: 11 .  levothyroxine (SYNTHROID, LEVOTHROID) 75 MCG tablet, , Disp: , Rfl:  .  lovastatin (MEVACOR) 10 MG tablet, Take 10 mg by mouth at bedtime., Disp: , Rfl:  .  methylphenidate (RITALIN) 20 MG tablet, Take one tablet in the morning and 1/2 tablet at noon, Disp: 45 tablet, Rfl: 0 .  Naproxen Sodium 220 MG CAPS, Take by mouth., Disp: , Rfl:  .  NUEDEXTA 20-10 MG CAPS, Take 20 mg by mouth 2 (two) times daily., Disp: 60 capsule, Rfl: 3 .  oxybutynin (DITROPAN) 5 MG tablet, Take 1 tablet (5 mg total) by mouth 2 (two) times daily., Disp: 60 tablet, Rfl: 11  PAST MEDICAL HISTORY: Past Medical History  Diagnosis Date  . MS (multiple sclerosis) ([place])   . Depressed   . Hyperlipemia   . Movement disorder   . Vision abnormalities     PAST SURGICAL HISTORY: Past Surgical History  Procedure Laterality Date  . Abdominal hysterectomy      FAMILY HISTORY: History reviewed. No pertinent family history.  SOCIAL HISTORY:  Social History   Social History  . Marital Status: Married    Spouse Name: N/A  . Number of Children: N/A  . Years of Education: N/A   Occupational History  . Not on file.   Social History Main Topics  . Smoking status: Never Smoker   . Smokeless tobacco: Not on file  . Alcohol Use: No  . Drug Use: No  . Sexual Activity: No   Other Topics Concern  . Not on file   Social History Narrative     PHYSICAL EXAM  Filed Vitals:   11/08/15 1521  BP: 116/68  Pulse: 72  Resp: 16  Height: 5' 7.5" (1.715 m)  Weight: 125 lb (56.7 kg)    Body mass index is 19.28 kg/(m^2).   General: The patient is well-developed and well-nourished and in no acute distress.     Eyes:  Funduscopic exam shows normal optic discs and retinal vessels.  Neck: The neck is supple, no  carotid bruits are noted.  The neck is nontender.  Cardiovascular: The heart has a regular rate and rhythm with a normal S1 and S2. There were no murmurs, gallops or rubs. Lungs are clear to auscultation.  Skin: Extremities are without significant edema.  Musculoskeletal:  Back is nontender  Neurologic Exam  Mental status: The patient is alert and oriented x 3 at the time of the examination. She did not have any crying spells and did not yell at her mother during this  visit as she did at the last visit.. She is distractible   There is some flight of ideas and she needs constant redirection.   Memory and attention are reduced but are baseline for her.    Speech is normal.  Cranial nerves: Extraocular movements are full.    Facial symmetry is present. There is good facial sensation to soft touch bilaterally.Facial strength is normal.  Trapezius and sternocleidomastoid strength is normal. No dysarthria is noted.    No obvious hearing deficits are noted.  Motor:  Muscle bulk is normal.   Tone is slightly increased in legs.. Strength is  5 / 5 in all 4 extremities.   Sensory: Sensory testing is intact to soft touch and vibration sensation in all 4 extremities.  Coordination: Cerebellar testing reveals good finger-nose-finger and reduced heel-to-shin in right leg.  Gait and station: Station is normal.   Gait is wide but she can walk without her cane. She can't tandem walk. Romberg is negative.   Reflexes: Deep tendon reflexes are increased on both legs.   Plantar responses are flexor.    DIAGNOSTIC DATA (LABS, IMAGING, TESTING) - I reviewed patient records, labs, notes, testing and imaging myself where available.     ASSESSMENT AND PLAN  Multiple sclerosis (HCC)  Abnormal gait  Clinical depression  Other fatigue  Pseudobulbar affect  Insomnia  Organic brain syndrome, psychotic   1.   Continue buspirone and citalopram and Nuedexta. Mood appears better than her last visit.    I recommended that she follow-up with psychiatry due to her agitated depression and poor ability to handle stress.    2.  Methylphenidate was renewed. 3.   Continue Gilenya. Around the time of her next visit, I will consider checking an MRI of the brain to make sure that she is not having subclinical progression. If this is occurring, we will need to consider another disease modifying therapy. 4.   She is advised to stay active and to use her cane or walker to help prevent falls. 5.   Return in 5 months or sooner if there are new or worsening neurologic symptoms.   Roshanda Balazs A. Felecia Shelling, MD, PhD [id number], [id number] PM Certified in Neurology, Clinical Neurophysiology, Sleep Medicine, Pain Medicine and Neuroimaging  [facility] [address] [place], [place] [address] [phone]

## 2015-11-09 NOTE — Telephone Encounter (Signed)
I have spoken with Dawn Keith and advised when pa is needed, her pharmacy will let me know/fim

## 2016-01-04 ENCOUNTER — Other Ambulatory Visit: Payer: Self-pay | Admitting: Neurology

## 2016-01-09 ENCOUNTER — Telehealth: Payer: Self-pay | Admitting: Neurology

## 2016-01-09 MED ORDER — NUEDEXTA 20-10 MG PO CAPS: 20.0000 mg | ORAL_CAPSULE | Freq: Two times a day (BID) | ORAL | Status: DC

## 2016-01-09 MED ORDER — FINGOLIMOD HCL 0.5 MG PO CAPS: 0.5000 mg | ORAL_CAPSULE | Freq: Every day | ORAL | Status: DC

## 2016-01-09 NOTE — Telephone Encounter (Signed)
Gilenya rx. escribed to [facility].  I have spoken with Timmeka and let her know this has been taken care of/fim

## 2016-01-09 NOTE — Telephone Encounter (Signed)
I have spoken with Dawn Keith--Nuedexta escribed to [facility] per her request./fim

## 2016-01-09 NOTE — Telephone Encounter (Signed)
pts insurance has approved medication Gilenya but when she goes to get it filled the pharmacy is telling her the rx has expired . Please call pt to update [phone]

## 2016-01-09 NOTE — Telephone Encounter (Signed)
Patient called to advise, NUEDEXTA 20-10 MG CAPS is about to expire and will need renewal. Patient request that Faith call her [phone].

## 2016-01-13 ENCOUNTER — Telehealth: Payer: Self-pay | Admitting: Diagnostic Neuroimaging

## 2016-01-13 NOTE — Telephone Encounter (Signed)
Noted.  Message printed, in RAS box for review/fim

## 2016-01-13 NOTE — Telephone Encounter (Signed)
Patient husband called in to ask advice. Patient has been getting weaker this week (last 3 days). Having some fevers, generalized pain/moaning, chest pain, overall malaise. Husband mentioned that pt's mother has been dx'd with cancer ans this could be an additional stress factor.   This sounds like possible infectious cause vs MS symptoms. Hopefully not a cardiac or lung (DVT, pneumonia) issue. I advised to go to ER for expedited evaluation. I will forward note to Dr. Felecia Shelling for review as well.  Penni Bombard, MD [id number], [id number] AM Certified in Neurology, Neurophysiology and Neuroimaging  [facility] [address] [place], [place] [address] [phone]

## 2016-01-14 ENCOUNTER — Telehealth: Payer: Self-pay | Admitting: Diagnostic Neuroimaging

## 2016-01-14 NOTE — Telephone Encounter (Signed)
Pt husband called again. Pt admitted to [facility]. They are not giving gilenya to her yet (going through pharmacy verification). Reassured pt husband that hospital is going through proper steps to verify home medication. If pt not able to get gilenya, then husband should contact the hospital attending and discuss issue. i am available to the hospital attending to discuss any concerns.    Penni Bombard, MD [id number], [id number] AM Certified in Neurology, Neurophysiology and Neuroimaging  [facility] [address] [place], [place] [address] [phone]

## 2016-01-16 ENCOUNTER — Telehealth: Payer: Self-pay | Admitting: Neurology

## 2016-01-16 NOTE — Telephone Encounter (Signed)
I have spoken with Dawn Keith this morning and confirmed that he will bring Andrienne in on 01-23-16 at 1120 for  hospital f/u.  She is on the schedule for earlier that day, but I told Bill to ignore time given on robo call, and still be here at 1120.  He verbalized understanding of same/fim

## 2016-01-16 NOTE — Telephone Encounter (Signed)
I spoke to Dawn Keith husband, Rush Landmark,. She is in the hospital with a pneumonia that felt to probably be aspiration pneumonia. They've just been told her swallow study looked good.  I like to see her in the office 01/23/2016 at 11:20 AM for a follow-up

## 2016-01-17 DIAGNOSIS — D72825 Bandemia: Secondary | ICD-10-CM | POA: Insufficient documentation

## 2016-01-17 DIAGNOSIS — E876 Hypokalemia: Secondary | ICD-10-CM | POA: Insufficient documentation

## 2016-01-18 DIAGNOSIS — J189 Pneumonia, unspecified organism: Secondary | ICD-10-CM | POA: Insufficient documentation

## 2016-01-19 NOTE — Telephone Encounter (Signed)
Pt's husband called sts she is bring transferred to Rehab tomorrow but she is expected to be there for 1-2 weeks so she will not be able to come to appt on Monday 5/1. He said the hospital is using her gilenya and she will be running out in the next few days. She needs a refill. Please call him [phone] (c)

## 2016-01-19 NOTE — Telephone Encounter (Signed)
I have spoken with Dawn Keith this morning, advised Gilenya was escribed to  [facility] on 01-09-16.  He should call them at [phone] to schedule delivery/fim

## 2016-01-22 DIAGNOSIS — D473 Essential (hemorrhagic) thrombocythemia: Secondary | ICD-10-CM | POA: Insufficient documentation

## 2016-01-22 DIAGNOSIS — D75839 Thrombocytosis, unspecified: Secondary | ICD-10-CM | POA: Insufficient documentation

## 2016-01-23 ENCOUNTER — Ambulatory Visit: Payer: Self-pay | Admitting: Neurology

## 2016-01-23 DIAGNOSIS — J13 Pneumonia due to Streptococcus pneumoniae: Secondary | ICD-10-CM | POA: Insufficient documentation

## 2016-01-25 DIAGNOSIS — J869 Pyothorax without fistula: Secondary | ICD-10-CM | POA: Insufficient documentation

## 2016-01-25 DIAGNOSIS — J852 Abscess of lung without pneumonia: Secondary | ICD-10-CM | POA: Insufficient documentation

## 2016-01-25 DIAGNOSIS — I950 Idiopathic hypotension: Secondary | ICD-10-CM | POA: Insufficient documentation

## 2016-02-28 ENCOUNTER — Telehealth: Payer: Self-pay | Admitting: Neurology

## 2016-02-28 NOTE — Telephone Encounter (Addendum)
Pt called said Dept of SS said they need a letter of disability. She is unable to schedule and appt today, her husband is out of town, he brings her to appointments but he will be back tomorrow. She will call back to schedule appt.tomorrow  FYI

## 2016-03-15 DIAGNOSIS — E559 Vitamin D deficiency, unspecified: Secondary | ICD-10-CM | POA: Insufficient documentation

## 2016-03-15 DIAGNOSIS — Z1159 Encounter for screening for other viral diseases: Secondary | ICD-10-CM | POA: Insufficient documentation

## 2016-03-19 ENCOUNTER — Telehealth: Payer: Self-pay | Admitting: Neurology

## 2016-03-19 NOTE — Telephone Encounter (Signed)
Pt called request to know the date she was dx with MS. She thought it to be about 9 yrs ago, this would be in [place]. Call was transferred to [facility].

## 2016-03-20 ENCOUNTER — Telehealth: Payer: Self-pay | Admitting: Neurology

## 2016-03-20 ENCOUNTER — Encounter: Payer: Self-pay | Admitting: *Deleted

## 2016-03-20 MED ORDER — FINGOLIMOD HCL 0.5 MG PO CAPS: 0.5000 mg | ORAL_CAPSULE | Freq: Every day | ORAL | Status: DC

## 2016-03-20 NOTE — Telephone Encounter (Signed)
Pt called in to know if Faith could find out the exact date she was diagnosed with MS. She needs to know for the insurance company and disability. Please call and advise

## 2016-03-20 NOTE — Telephone Encounter (Signed)
I have spoken with Dawn Keith this afternoon and per RAS ov note, advised she was dx. with MS in 2012/fim

## 2016-03-20 NOTE — Telephone Encounter (Signed)
Patient called, states [facility] has advised, Fingolimod HCl 0.5 MG CAPS has expired, needs new Rx.

## 2016-03-20 NOTE — Telephone Encounter (Signed)
Gilenya escribed to [facility] Delivery as requested/fim

## 2016-03-22 ENCOUNTER — Other Ambulatory Visit: Payer: Self-pay | Admitting: Neurology

## 2016-03-29 ENCOUNTER — Other Ambulatory Visit: Payer: Self-pay | Admitting: Neurology

## 2016-03-30 NOTE — Telephone Encounter (Signed)
Received request for refill from local pharmacy rather than home delivery pharmacy. Follow-up appt scheduled soon (04/10/16) and can check w/ pt on continuation of mail order at that time. Retailed x 1 month.

## 2016-04-05 ENCOUNTER — Telehealth: Payer: Self-pay | Admitting: Neurology

## 2016-04-05 NOTE — Telephone Encounter (Signed)
I have spoken with [employer] this morning.  She sts. she would like to try and get pt. assistance to help with $50 copays.  I have referred her to [facility] for financial assistance/charity care application/fim

## 2016-04-05 NOTE — Telephone Encounter (Signed)
Patient is calling for a returned call to discuss her upcoming appointment and "other stuff".

## 2016-04-10 ENCOUNTER — Encounter: Payer: Self-pay | Admitting: Neurology

## 2016-04-10 ENCOUNTER — Ambulatory Visit (INDEPENDENT_AMBULATORY_CARE_PROVIDER_SITE_OTHER): Payer: Managed Care, Other (non HMO) | Admitting: Neurology

## 2016-04-10 ENCOUNTER — Ambulatory Visit: Payer: Managed Care, Other (non HMO) | Admitting: Neurology

## 2016-04-10 VITALS — BP 130/64 | HR 82 | Resp 18 | Ht 67.5 in | Wt 120.0 lb

## 2016-04-10 DIAGNOSIS — F32A Depression, unspecified: Secondary | ICD-10-CM

## 2016-04-10 DIAGNOSIS — F329 Major depressive disorder, single episode, unspecified: Secondary | ICD-10-CM | POA: Diagnosis not present

## 2016-04-10 DIAGNOSIS — F482 Pseudobulbar affect: Secondary | ICD-10-CM | POA: Diagnosis not present

## 2016-04-10 DIAGNOSIS — N3942 Incontinence without sensory awareness: Secondary | ICD-10-CM | POA: Diagnosis not present

## 2016-04-10 DIAGNOSIS — G35 Multiple sclerosis: Secondary | ICD-10-CM | POA: Diagnosis not present

## 2016-04-10 DIAGNOSIS — R5383 Other fatigue: Secondary | ICD-10-CM

## 2016-04-10 DIAGNOSIS — R269 Unspecified abnormalities of gait and mobility: Secondary | ICD-10-CM

## 2016-04-10 MED ORDER — METHYLPHENIDATE HCL 20 MG PO TABS: ORAL_TABLET | ORAL | Status: DC

## 2016-04-10 MED ORDER — LAMOTRIGINE 100 MG PO TABS: ORAL_TABLET | ORAL | Status: DC

## 2016-04-10 NOTE — Progress Notes (Signed)
[facility]  PATIENT: Dawn Keith DOB: 1960/12/21  REFERRING DOCTOR OR PCP:  None  SOURCE: patient  _________________________________   HISTORICAL  CHIEF COMPLAINT:  Chief Complaint  Patient presents with  . Multiple Sclerosis    Sts. she continues to tolerate Gilenya well.  She was hospitalized in May for pneumonia. Ambulatory with a cane./fim  . Anxiety    Sts. her psychiatrist started her on Lamotrigine for anxiety/panic attacks.  Sts. she doesn't think she'll be able to afford to continue seeing psych/fim    HISTORY OF PRESENT ILLNESS:  Dawn Keith is a 55 yo woman with MS.    She feels her MS has been mostly stable but she gets frustrated easily.     She is on Gilenya and tolerates it well.   She has not had definite exacerbations but continues to feel poorly.    Pneumonia:   She was hospitalized for pneumonia in May and then did a week of neuro-rehab (Dr. Sandie Ano) afterwards.    She got back to walking with a cane (felt much weaker with pneumonia)  Gait/Strength/Sensation:   She still has difficulty with her gait and stumbles a lot and sometimes falls.  She has had a couple falls that led to injuries.     Her right side is  weaker than the left.   She has no definite numbness or painful tingling.   She feels weaker in hot weather.   She was able to climb a ladder (though not well) in 2015.    Bladder:  She has frequency and urgency and was having incontinence.    Oxybutynin was started and has helped.   There is no hesitancy and she thinks she empties her bladder well. There have not been recent urinary tract infections.  Vision:   She reports no new visual symptoms.     She is scheduled to see ophthalmology soon for her annual exam.  Fatigue/sleep:   She has cognitive and physical fatigue.   Ritalin has helped.  She is on 10 mg in the morning and 5 mg at noon and tolerates it well.   No anorexia or worsening of sleep.      She is sleeping better on current  medications.  She still worries a lot about the next day and family issues.    If she wakes up after a few hours of sleep that she may not be on fall back asleep again.  Mood/Cognition:   Mood issues are better on lamotrigine   She still feels stressed but is having fewer outbursts.  .  Celexa may have helped.  Also her mother recently died (cancer).  Nuedexta helps some as well.     MS history:  She was diagnosed with MS in 2012 after several years of worsening gait.      She started on Gilenya and has tolerated it well and has had no defiinite exacerbation but has worsened.   Her last MRI was 2015 at [place] in [facility].        REVIEW OF SYSTEMS: Constitutional: No fevers, chills, sweats, or change in appetite.  Notes fatigue Eyes: No visual changes, double vision, eye pain Ear, nose and throat: No hearing loss, ear pain, nasal congestion, sore throat Cardiovascular: No chest pain, palpitations Respiratory: No shortness of breath at rest or with exertion.   No wheezes.   Pneumonia in 2017 GastrointestinaI: No nausea, vomiting, diarrhea, abdominal pain, fecal incontinence Genitourinary: as above Musculoskeletal:  No neck pain, back pain Integumentary: No rash, pruritus, skin lesions Neurological: as above Psychiatric: as above Endocrine: No palpitations, diaphoresis, change in appetite, change in weigh or increased thirst Hematologic/Lymphatic: No anemia, purpura, petechiae. Allergic/Immunologic: No itchy/runny eyes, nasal congestion, recent allergic reactions, rashes  ALLERGIES: Allergies  Allergen Reactions  . Donepezil     "makes me mean"  . Latex     HOME MEDICATIONS:  Current outpatient prescriptions:  .  busPIRone (BUSPAR) 15 MG tablet, Take 1 tablet (15 mg total) by mouth 2 (two) times daily., Disp: 60 tablet, Rfl: 11 .  Cholecalciferol 5000 UNITS TABS, Take by mouth., Disp: , Rfl:  .  citalopram (CELEXA) 40 MG tablet, Take 1 tablet (40 mg total) by mouth  daily., Disp: 30 tablet, Rfl: 11 .  citalopram (CELEXA) 40 MG tablet, TAKE ONE TABLET BY MOUTH ONCE DAILY, Disp: 30 tablet, Rfl: 0 .  Cyanocobalamin (VITAMIN B-12) 2500 MCG TABS, Take 2,500 mcg by mouth daily., Disp: 30 tablet, Rfl: 5 .  DOCOSAHEXAENOIC ACID PO, Take by mouth., Disp: , Rfl:  .  Fingolimod HCl 0.5 MG CAPS, Take 1 capsule (0.5 mg total) by mouth daily., Disp: 90 capsule, Rfl: 3 .  lamoTRIgine (LAMICTAL) 25 MG tablet, Take 25 mg by mouth., Disp: , Rfl:  .  levothyroxine (SYNTHROID, LEVOTHROID) 75 MCG tablet, , Disp: , Rfl:  .  lovastatin (MEVACOR) 10 MG tablet, Take 10 mg by mouth at bedtime., Disp: , Rfl:  .  methylphenidate (RITALIN) 20 MG tablet, Take one tablet in the morning and 1/2 tablet at noon, Disp: 45 tablet, Rfl: 0 .  Naproxen Sodium 220 MG CAPS, Take by mouth., Disp: , Rfl:  .  NUEDEXTA 20-10 MG CAPS, Take 20 mg by mouth 2 (two) times daily., Disp: 60 capsule, Rfl: 11 .  oxybutynin (DITROPAN) 5 MG tablet, TAKE ONE TABLET BY MOUTH TWICE DAILY, Disp: 60 tablet, Rfl: 11  PAST MEDICAL HISTORY: Past Medical History  Diagnosis Date  . MS (multiple sclerosis) ([place])   . Depressed   . Hyperlipemia   . Movement disorder   . Vision abnormalities     PAST SURGICAL HISTORY: Past Surgical History  Procedure Laterality Date  . Abdominal hysterectomy      FAMILY HISTORY: History reviewed. No pertinent family history.  SOCIAL HISTORY:  Social History   Social History  . Marital Status: Married    Spouse Name: N/A  . Number of Children: N/A  . Years of Education: N/A   Occupational History  . Not on file.   Social History Main Topics  . Smoking status: Never Smoker   . Smokeless tobacco: Not on file  . Alcohol Use: No  . Drug Use: No  . Sexual Activity: No   Other Topics Concern  . Not on file   Social History Narrative     PHYSICAL EXAM  Filed Vitals:   04/10/16 1100  BP: 130/64  Pulse: 82  Resp: 18  Height: 5' 7.5" (1.715 m)  Weight: 120  lb (54.432 kg)    Body mass index is 18.51 kg/(m^2).   General: The patient is well-developed and well-nourished and in no acute distress.      Skin: Extremities are without rash or significant edema.  Musculoskeletal:  Back is nontender  Neurologic Exam  Mental status: The patient is alert and oriented x 3 at the time of the examination. Less flight of ideas and distractability than last visit.    Memory and attention are reduced but  are baseline for her.    Speech is normal.  Cranial nerves: Extraocular movements are full.    Facial symmetry is present. There is good facial sensation to soft touch bilaterally.Facial strength is normal.  Trapezius and sternocleidomastoid strength is normal. No dysarthria is noted.    No obvious hearing deficits are noted.  Motor:  Muscle bulk is normal.   Tone is slightly increased in legs.. Strength is  5 / 5 in all 4 extremities.   Sensory: Sensory testing is intact to soft touch and vibration sensation in all 4 extremities.  Coordination: Cerebellar testing reveals good finger-nose-finger and reduced heel-to-shin in right leg.  Gait and station: Station is normal.   Gait is wide but she can walk without her cane (better with). She can't tandem walk. Romberg is negative.   Reflexes: Deep tendon reflexes are increased on both legs.      DIAGNOSTIC DATA (LABS, IMAGING, TESTING) - I reviewed patient records, labs, notes, testing and imaging myself where available.     ASSESSMENT AND PLAN  Multiple sclerosis (HCC)  Abnormal gait  Clinical depression  Other fatigue  Pseudobulbar affect   1.   Continue buspirone/citalopram/Nuedexta. Mood appears better on lamotrigine and I can take over scripts.      2.  Methylphenidate was renewed. 3.   Continue Gilenya. She wants to hold off on checking an other brain MRI for subclinical progression due to financial issues.. 4.   She is advised to stay active and to use her cane or walker to help  prevent falls. 5.   Return in 5 months or sooner if there are new or worsening neurologic symptoms.   Richard A. Felecia Shelling, MD, PhD [id number], [id number] AM Certified in Neurology, Clinical Neurophysiology, Sleep Medicine, Pain Medicine and Neuroimaging  [facility] [address] [place], [place] [address] [phone]

## 2016-04-11 ENCOUNTER — Encounter: Payer: Self-pay | Admitting: *Deleted

## 2016-04-11 ENCOUNTER — Telehealth: Payer: Self-pay | Admitting: Neurology

## 2016-04-11 NOTE — Telephone Encounter (Signed)
Patient is calling back stating in the note below the letter needs to state the patient has MS and why she is disabled. Also explain MS and what area of the body it affects.

## 2016-04-11 NOTE — Telephone Encounter (Signed)
Pt needs letter for a disability claim. She did not specify what needs to be stated. Also requesting it be mailed to her home.

## 2016-04-11 NOTE — Telephone Encounter (Signed)
Letter is awaiting RAS sig/fim

## 2016-04-12 NOTE — Telephone Encounter (Signed)
Letter mailed as requested/fim 

## 2016-04-26 ENCOUNTER — Other Ambulatory Visit: Payer: Self-pay | Admitting: Neurology

## 2016-05-01 ENCOUNTER — Telehealth: Payer: Self-pay | Admitting: Neurology

## 2016-05-01 NOTE — Telephone Encounter (Signed)
Extended, pleasant conversation with pt.  She had no specific concern or request, just wanted to talk/fim

## 2016-05-01 NOTE — Telephone Encounter (Signed)
Pt requested Faith to call. She did not want to go into detail

## 2016-06-05 ENCOUNTER — Telehealth: Payer: Self-pay | Admitting: Neurology

## 2016-06-05 MED ORDER — LAMOTRIGINE 100 MG PO TABS: ORAL_TABLET | ORAL | 11 refills | Status: DC

## 2016-06-05 NOTE — Telephone Encounter (Signed)
Lamictal escribed to [facility] as requested/fim 

## 2016-06-05 NOTE — Telephone Encounter (Signed)
Patient called to request refill of LAMOTRIGINE 25 MG to [facility] [place], states she is completely out of this medication.

## 2016-06-14 ENCOUNTER — Telehealth: Payer: Self-pay | Admitting: Neurology

## 2016-06-14 NOTE — Telephone Encounter (Signed)
Left message for Bill to call back/fim

## 2016-06-14 NOTE — Telephone Encounter (Signed)
Bill returned Dawn Keith's call, please call [phone].
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
## 2016-06-14 NOTE — Telephone Encounter (Signed)
LMOM for Dawn Keith to call back/fim

## 2016-06-14 NOTE — Telephone Encounter (Signed)
He sts. he believes she is taking her medications incorrectly. Sts. he tried to pick up r/f of some meds, b/c Dawn Keith told him she was out, but the pharmacist adivsed it was too early to get r/f.  He will take her to [facility] ER for psych eval/fim

## 2016-06-14 NOTE — Telephone Encounter (Signed)
I have spoken with Rush Landmark this afternoon and per RAS, advised that at this point, the best option would be to have a psych eval

## 2016-06-14 NOTE — Telephone Encounter (Signed)
Pt's husband returned RN's call

## 2016-06-20 ENCOUNTER — Telehealth: Payer: Self-pay | Admitting: Neurology

## 2016-06-20 ENCOUNTER — Emergency Department (HOSPITAL_COMMUNITY)
Admission: EM | Admit: 2016-06-20 | Discharge: 2016-06-20 | Disposition: A | Discharge status: Home / Self Care | Payer: Managed Care, Other (non HMO) | Attending: Emergency Medicine | Admitting: Emergency Medicine

## 2016-06-20 DIAGNOSIS — F419 Anxiety disorder, unspecified: Secondary | ICD-10-CM | POA: Diagnosis not present

## 2016-06-20 DIAGNOSIS — E039 Hypothyroidism, unspecified: Secondary | ICD-10-CM | POA: Diagnosis not present

## 2016-06-20 DIAGNOSIS — Z79899 Other long term (current) drug therapy: Secondary | ICD-10-CM | POA: Diagnosis not present

## 2016-06-20 DIAGNOSIS — F600 Paranoid personality disorder: Secondary | ICD-10-CM | POA: Diagnosis present

## 2016-06-20 LAB — COMPREHENSIVE METABOLIC PANEL
ALK PHOS: 48 U/L (ref 38–126)
ALT: 25 U/L (ref 14–54)
AST: 26 U/L (ref 15–41)
Albumin: 4.1 g/dL (ref 3.5–5.0)
Anion gap: 7 (ref 5–15)
BUN: 26 mg/dL — AB (ref 6–20)
CALCIUM: 9.1 mg/dL (ref 8.9–10.3)
CO2: 28 mmol/L (ref 22–32)
CREATININE: 0.84 mg/dL (ref 0.44–1.00)
Chloride: 107 mmol/L (ref 101–111)
Glucose, Bld: 91 mg/dL (ref 65–99)
Potassium: 4.1 mmol/L (ref 3.5–5.1)
Sodium: 142 mmol/L (ref 135–145)
Total Bilirubin: 0.8 mg/dL (ref 0.3–1.2)
Total Protein: 7.2 g/dL (ref 6.5–8.1)

## 2016-06-20 LAB — CBC
HEMATOCRIT: 37.2 % (ref 36.0–46.0)
Hemoglobin: 12.1 g/dL (ref 12.0–15.0)
MCH: 30.3 pg (ref 26.0–34.0)
MCHC: 32.5 g/dL (ref 30.0–36.0)
MCV: 93.2 fL (ref 78.0–100.0)
PLATELETS: 371 10*3/uL (ref 150–400)
RBC: 3.99 MIL/uL (ref 3.87–5.11)
RDW: 14.6 % (ref 11.5–15.5)
WBC: 6.5 10*3/uL (ref 4.0–10.5)

## 2016-06-20 LAB — ETHANOL
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
## 2016-06-20 NOTE — Telephone Encounter (Signed)
Dawn Keith [phone] (friend) called with the pt on the phone also. She said she just spoke with Education officer, museum and was advised to have the pt's provider fill out a FL2 form. She said the pt needs to be placed in an assisted living if she will go.

## 2016-06-20 NOTE — ED Notes (Signed)
Bed: WTR5 Expected date:  Expected time:  Means of arrival:  Comments: 

## 2016-06-20 NOTE — Telephone Encounter (Signed)
Dawn Keith is not on pt's hipaa--I am not able to communicate with  her./fim
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
## 2016-06-28 ENCOUNTER — Telehealth: Payer: Self-pay | Admitting: Neurology

## 2016-06-28 NOTE — Telephone Encounter (Signed)
Patient called requesting to speak with Faith regarding visits w/Dr. Felecia Shelling. Patient asks that Faith call ASAP [phone].

## 2016-07-05 ENCOUNTER — Telehealth: Payer: Self-pay | Admitting: Neurology

## 2016-07-05 MED ORDER — NUEDEXTA 20-10 MG PO CAPS
20.0000 mg | ORAL_CAPSULE | Freq: Two times a day (BID) | ORAL | 3 refills | Status: DC
Start: 2016-07-05 — End: 2016-12-06

## 2016-07-05 NOTE — Telephone Encounter (Signed)
Patient called to request refill of NUEDEXTA 20-10 MG CAPS

## 2016-07-05 NOTE — Addendum Note (Signed)
Addended by: France Ravens I on: 07/05/2016 10:49 AM   Modules accepted: Orders

## 2016-07-05 NOTE — Telephone Encounter (Signed)
LMOM Bill's cell phone to let him know I sent Nudexta r/f to [facility] Delivery/fim

## 2016-07-05 NOTE — Telephone Encounter (Signed)
Pt called in to say to call her husbands phone [phone]

## 2016-07-16 ENCOUNTER — Telehealth: Payer: Self-pay | Admitting: Neurology

## 2016-07-16 NOTE — Telephone Encounter (Signed)
Patient called back to add, [employer] needs to know, how long disabled.

## 2016-07-16 NOTE — Telephone Encounter (Signed)
Letter from 04-11-16, stating pt. is totally and permanently disabled from any type of gainful employment printed and faxed to [facility]

## 2016-07-16 NOTE — Telephone Encounter (Signed)
Pt called to advise she will need a letter faxed to [facility] (f) [phone]  (p) [phone] reg her life insurance stating she is disabled. She was advised today she won't have to make payments for life insurance anymore if she is disabled.

## 2016-07-17 NOTE — Telephone Encounter (Signed)
Patient called back to advise, she spoke with Sandi earlier and wants to let Carlyon Prows know that [facility] received all of the faxed information needed, "they have everything they need."

## 2016-07-19 NOTE — Telephone Encounter (Signed)
Fax: [phone] - [employer] Attn: Ruby Cola

## 2016-07-19 NOTE — Telephone Encounter (Signed)
Handwritten addendum to letter added, stating pt. was dx. with MS in 2012, faxed to [facility], fax# [phone], as requested/fim

## 2016-07-19 NOTE — Telephone Encounter (Signed)
Patient called to advise, [employer] needs to know when she was diagnosed with MS, patient also would like to know this information. Please call [phone].

## 2016-07-23 ENCOUNTER — Telehealth: Payer: Self-pay | Admitting: Neurology

## 2016-07-23 NOTE — Telephone Encounter (Signed)
Pt called to advise that she takes oxybutynin (DITROPAN) 5 MG tablet in the am and at lunch, this helps to control the bladder. She sts her daughter-Shelby will not give her a tablet at lunch. She and her daughter were arguing about this while she was talking with me on phone. She said her husband and Wilburn Cornelia advised her she will need to call to get clarification.

## 2016-07-23 NOTE — Telephone Encounter (Signed)
I have spoken with Dawn Keith this afternoon and explained that Oxybutynin is to be taken BID.  It is best if the doses are 10-12 hrs. apart/fim

## 2016-07-24 NOTE — Telephone Encounter (Signed)
Pt called in asking that the date she was dx with MS be place on the letter. It looks like she was dx in 2017. She says [employer] thinks she was dx and became disable in 2017. Please call and advise [phone]

## 2016-07-24 NOTE — Telephone Encounter (Signed)
Pt called to advise that [facility] does not have the addendum. Please fax it again

## 2016-07-25 NOTE — Telephone Encounter (Signed)
Letter with handwritten addendum, stating pt. was dx. with MS in 2012 refaxed to [facility]

## 2016-07-26 ENCOUNTER — Telehealth: Payer: Self-pay | Admitting: Neurology

## 2016-07-26 NOTE — Telephone Encounter (Signed)
Noted/fim 

## 2016-07-26 NOTE — Telephone Encounter (Signed)
Patient called back to advise Faith, she spoke with [facility] and they have everything they need, Attending Physician Statement not needed.

## 2016-07-26 NOTE — Telephone Encounter (Signed)
Patient called to advise nurse Faith, she will be [place] Attending Physician Statement for Dr. Felecia Shelling to fill out and sign. Patient advised of $50 form fee and 7-14 day turn around time.

## 2016-08-07 ENCOUNTER — Encounter: Payer: Self-pay | Admitting: Neurology

## 2016-08-07 ENCOUNTER — Ambulatory Visit (INDEPENDENT_AMBULATORY_CARE_PROVIDER_SITE_OTHER): Payer: Managed Care, Other (non HMO) | Admitting: Neurology

## 2016-08-07 VITALS — BP 114/66 | HR 74 | Resp 14 | Ht 67.0 in | Wt 125.0 lb

## 2016-08-07 DIAGNOSIS — R5383 Other fatigue: Secondary | ICD-10-CM | POA: Diagnosis not present

## 2016-08-07 DIAGNOSIS — F32A Depression, unspecified: Secondary | ICD-10-CM

## 2016-08-07 DIAGNOSIS — R269 Unspecified abnormalities of gait and mobility: Secondary | ICD-10-CM | POA: Diagnosis not present

## 2016-08-07 DIAGNOSIS — N3942 Incontinence without sensory awareness: Secondary | ICD-10-CM | POA: Diagnosis not present

## 2016-08-07 DIAGNOSIS — F329 Major depressive disorder, single episode, unspecified: Secondary | ICD-10-CM | POA: Diagnosis not present

## 2016-08-07 DIAGNOSIS — G35 Multiple sclerosis: Secondary | ICD-10-CM

## 2016-08-07 MED ORDER — METHYLPHENIDATE HCL 20 MG PO TABS: ORAL_TABLET | ORAL | 0 refills | Status: DC
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
## 2016-10-25 ENCOUNTER — Telehealth: Payer: Self-pay | Admitting: Neurology

## 2016-10-25 NOTE — Telephone Encounter (Signed)
I have spoken with Dawn Keith about her medications./fim

## 2016-10-25 NOTE — Telephone Encounter (Signed)
Patient called in reference to all of her medications.  States she is having to pay cash price and wanting to know if she is on the "bare-bone".  Please call

## 2016-10-29 NOTE — Telephone Encounter (Signed)
Pt says she needs to speak with RN about how much each med is going to cost her. Please call

## 2016-10-29 NOTE — Telephone Encounter (Signed)
Pt called returned RN's call.

## 2016-10-29 NOTE — Telephone Encounter (Signed)
LMTC./fim 

## 2016-12-06 ENCOUNTER — Encounter: Payer: Self-pay | Admitting: Neurology

## 2016-12-06 ENCOUNTER — Encounter (INDEPENDENT_AMBULATORY_CARE_PROVIDER_SITE_OTHER): Payer: Self-pay

## 2016-12-06 ENCOUNTER — Ambulatory Visit (INDEPENDENT_AMBULATORY_CARE_PROVIDER_SITE_OTHER): Payer: Managed Care, Other (non HMO) | Admitting: Neurology

## 2016-12-06 DIAGNOSIS — F329 Major depressive disorder, single episode, unspecified: Secondary | ICD-10-CM | POA: Diagnosis not present

## 2016-12-06 DIAGNOSIS — G35 Multiple sclerosis: Secondary | ICD-10-CM

## 2016-12-06 DIAGNOSIS — G4700 Insomnia, unspecified: Secondary | ICD-10-CM | POA: Diagnosis not present

## 2016-12-06 DIAGNOSIS — N3942 Incontinence without sensory awareness: Secondary | ICD-10-CM

## 2016-12-06 DIAGNOSIS — R5383 Other fatigue: Secondary | ICD-10-CM | POA: Diagnosis not present

## 2016-12-06 DIAGNOSIS — F32A Depression, unspecified: Secondary | ICD-10-CM

## 2016-12-06 DIAGNOSIS — F482 Pseudobulbar affect: Secondary | ICD-10-CM | POA: Diagnosis not present

## 2016-12-06 DIAGNOSIS — R269 Unspecified abnormalities of gait and mobility: Secondary | ICD-10-CM

## 2016-12-06 MED ORDER — CITALOPRAM HYDROBROMIDE 40 MG PO TABS: 40.0000 mg | ORAL_TABLET | Freq: Every day | ORAL | 3 refills | Status: DC

## 2016-12-06 MED ORDER — METHYLPHENIDATE HCL 20 MG PO TABS: ORAL_TABLET | ORAL | 0 refills | Status: DC

## 2016-12-06 MED ORDER — BUSPIRONE HCL 15 MG PO TABS: 15.0000 mg | ORAL_TABLET | Freq: Two times a day (BID) | ORAL | 3 refills | Status: DC

## 2016-12-06 MED ORDER — NUEDEXTA 20-10 MG PO CAPS: 20.0000 mg | ORAL_CAPSULE | Freq: Two times a day (BID) | ORAL | 3 refills | Status: DC

## 2016-12-06 MED ORDER — LAMOTRIGINE 100 MG PO TABS: ORAL_TABLET | ORAL | 3 refills | Status: DC

## 2016-12-06 MED ORDER — OXYBUTYNIN CHLORIDE 5 MG PO TABS: 5.0000 mg | ORAL_TABLET | Freq: Two times a day (BID) | ORAL | 3 refills | Status: DC

## 2016-12-06 NOTE — Progress Notes (Signed)
[facility]  PATIENT: Dawn Keith DOB: 19-Mar-1961  REFERRING DOCTOR OR PCP:  None  SOURCE: patient  _________________________________   HISTORICAL  CHIEF COMPLAINT:  Chief Complaint  Patient presents with  . Multiple Sclerosis    HISTORY OF PRESENT ILLNESS:  Dawn Keith is a 56 yo woman with MS.      MS:    She feels her MS has been mostly stable.     She is on Gilenya and tolerates it well.   She has had no definite exacerbations on it though has had mild worsening of gait over time.     Gait/Strength/Sensation:   She has difficulty with her gait.   She has had a few falls..  She mostly uses her cane but uses a walker when she feels worse or tired.      Her right side is mildly weaker than the left and is mildly spastic.   She has no definite numbness or painful tingling.    Bladder:  She has frequency and urgency occasioanl incontinence, helped by oxybutynin.   There is no hesitancy and she thinks she empties her bladder well. There have not been recent urinary tract infections.  Vision:   She reports no new visual symptoms.     She is scheduled to see ophthalmology soon for her annual exam.  Fatigue/sleep:   She has cognitive and physical fatigue.   Ritalin has helped her fatigue but she has not taken any for the last month when she ran out.   She tolerates it well    She is sleeping better on current medications.  She still worries a lot about the next day and family issues.    If she wakes up after a few hours of sleep that she may not be on fall back asleep again.  Mood/Cognition:   She feels mood is better on lamotrigine and Nuedexta.   She is stressed but is doing better with fewer outbursts.  .  Dawn Keith and Buspar also have helped.   She was seeing Psych but had trouble with copays so stopped.     MS history:  She was diagnosed with MS in 2012 after several years of worsening gait.      She started on Gilenya and has tolerated it well and has had no  defiinite exacerbation but has worsened.   Her last MRI was 2015 at [place] in [facility].        REVIEW OF SYSTEMS: Constitutional: No fevers, chills, sweats, or change in appetite.  Notes fatigue Eyes: No visual changes, double vision, eye pain Ear, nose and throat: No hearing loss, ear pain, nasal congestion, sore throat Cardiovascular: No chest pain, palpitations Respiratory: No shortness of breath at rest or with exertion.   No wheezes.   Pneumonia in 2017 GastrointestinaI: No nausea, vomiting, diarrhea, abdominal pain, fecal incontinence Genitourinary: as above Musculoskeletal: No neck pain, back pain Integumentary: No rash, pruritus, skin lesions Neurological: as above Psychiatric: as above Endocrine: No palpitations, diaphoresis, change in appetite, change in weigh or increased thirst Hematologic/Lymphatic: No anemia, purpura, petechiae. Allergic/Immunologic: No itchy/runny eyes, nasal congestion, recent allergic reactions, rashes  ALLERGIES: Allergies  Allergen Reactions  . Donepezil     "makes me mean"  . Latex     HOME MEDICATIONS:  Current Outpatient Prescriptions:  .  busPIRone (BUSPAR) 15 MG tablet, Take 1 tablet (15 mg total) by mouth 2 (two) times daily., Disp: 180 tablet, Rfl: 3 .  Cholecalciferol 5000 UNITS TABS, Take 5,000 Units by mouth daily. , Disp: , Rfl:  .  citalopram (CELEXA) 40 MG tablet, Take 1 tablet (40 mg total) by mouth daily., Disp: 90 tablet, Rfl: 3 .  Cyanocobalamin (VITAMIN B-12) 2500 MCG TABS, Take 2,500 mcg by mouth daily., Disp: 30 tablet, Rfl: 5 .  DOCOSAHEXAENOIC ACID PO, Take by mouth., Disp: , Rfl:  .  Docusate Calcium (STOOL SOFTENER PO), Take 1 tablet by mouth at bedtime., Disp: , Rfl:  .  Fingolimod HCl 0.5 MG CAPS, Take 1 capsule (0.5 mg total) by mouth daily., Disp: 90 capsule, Rfl: 3 .  lamoTRIgine (LAMICTAL) 100 MG tablet, Take 1 tablet twice daily, Disp: 180 tablet, Rfl: 3 .  levothyroxine (SYNTHROID,  LEVOTHROID) 75 MCG tablet, , Disp: , Rfl:  .  lovastatin (MEVACOR) 10 MG tablet, Take 10 mg by mouth at bedtime., Disp: , Rfl:  .  methylphenidate (RITALIN) 20 MG tablet, Take one tablet in the morning and 1/2 tablet at noon.   Fill on or after 01/06/2017, Disp: 45 tablet, Rfl: 0 .  Naproxen Sodium 220 MG CAPS, Take by mouth., Disp: , Rfl:  .  NUEDEXTA 20-10 MG CAPS, Take 20 mg by mouth 2 (two) times daily., Disp: 180 capsule, Rfl: 3 .  oxybutynin (DITROPAN) 5 MG tablet, Take 1 tablet (5 mg total) by mouth 2 (two) times daily., Disp: 180 tablet, Rfl: 3  PAST MEDICAL HISTORY: Past Medical History:  Diagnosis Date  . Depressed   . Hyperlipemia   . Movement disorder   . MS (multiple sclerosis) ([place])   . Vision abnormalities     PAST SURGICAL HISTORY: Past Surgical History:  Procedure Laterality Date  . ABDOMINAL HYSTERECTOMY      FAMILY HISTORY: No family history on file.  SOCIAL HISTORY:  Social History   Social History  . Marital status: Married    Spouse name: N/A  . Number of children: N/A  . Years of education: N/A   Occupational History  . Not on file.   Social History Main Topics  . Smoking status: Never Smoker  . Smokeless tobacco: Never Used  . Alcohol use No  . Drug use: No  . Sexual activity: No   Other Topics Concern  . Not on file   Social History Narrative  . No narrative on file     PHYSICAL EXAM  There were no vitals filed for this visit.  There is no height or weight on file to calculate BMI.   General: The patient is well-developed and well-nourished and in no acute distress.      Skin: Extremities are without rash or significant edema.  Musculoskeletal:  Back is nontender  Neurologic Exam  Mental status: The patient is alert and oriented x 3 at the time of the examination. She still gets off topic some as we talk but better than earlier this year.    Memory and attention are reduced but no changes.    Speech is normal.  Cranial  nerves: Extraocular movements are full.    He has normal facial strength and sensation.  Trapezius and sternocleidomastoid strength is normal. No dysarthria is noted.    No obvious hearing deficits are noted.  Motor:  Muscle bulk is normal.   Tone is slightly increased in legs, R > L.. Strength is  5 / 5 in all 4 extremities.   Sensory: Sensory testing is intact to soft touch and vibration sensation in all 4 extremities.  Coordination:  She has good finger nose finger bilaterally. The left heel to shin is fairly normal but the right heel-to-shin is reduced.  Gait and station: Station is normal.   Her gait shows a reduced stride and is wide. She can walk without a cane but does better with unilateral support.. She can't tandem walk. Romberg is negative.   Reflexes: Deep tendon reflexes are increased on both legs.       DIAGNOSTIC DATA (LABS, IMAGING, TESTING) - I reviewed patient records, labs, notes, testing and imaging myself where available.     ASSESSMENT AND PLAN  Multiple sclerosis ([place]) - Plan: CBC with Differential/Platelet, Hepatic function panel  Abnormal gait  Depression, unspecified depression type  Other fatigue  Pseudobulbar affect  Urinary incontinence without sensory awareness  Insomnia, unspecified type   1.   Continue Gilenya. Check labs.  MS appears stable.   I wanted to do MRI of the brain MRI to assess for subclinical progression to see if any med changes need to be made but she prefers to hold off due to financial issues.. 2.   Continue buspirone/citalopram/Nuedexta/lamotrigine for mood issues.      3.   Renew Methylphenidate for attention and fatigue 4.   Return in 5 months or sooner if there are new or worsening neurologic symptoms.   Dawn Anagnos A. Felecia Shelling, MD, PhD 0/67/7034, 0:35 PM Certified in Neurology, Clinical Neurophysiology, Sleep Medicine, Pain Medicine and Neuroimaging  [facility] [address] [place], [place]  [address] [phone]

## 2016-12-07 LAB — HEPATIC FUNCTION PANEL
ALT: 24 IU/L (ref 0–32)
AST: 23 IU/L (ref 0–40)
Albumin: 4.3 g/dL (ref 3.5–5.5)
Alkaline Phosphatase: 51 IU/L (ref 39–117)
BILIRUBIN TOTAL: 0.3 mg/dL (ref 0.0–1.2)
BILIRUBIN, DIRECT: 0.07 mg/dL (ref 0.00–0.40)
TOTAL PROTEIN: 6.6 g/dL (ref 6.0–8.5)

## 2016-12-07 LAB — CBC WITH DIFFERENTIAL/PLATELET
BASOS ABS: 0 10*3/uL (ref 0.0–0.2)
Basos: 0 %
EOS (ABSOLUTE): 0 10*3/uL (ref 0.0–0.4)
Eos: 0 %
Hematocrit: 37.7 % (ref 34.0–46.6)
Hemoglobin: 12.8 g/dL (ref 11.1–15.9)
IMMATURE GRANS (ABS): 0 10*3/uL (ref 0.0–0.1)
IMMATURE GRANULOCYTES: 1 %
LYMPHS: 10 %
Lymphocytes Absolute: 0.5 10*3/uL — ABNORMAL LOW (ref 0.7–3.1)
MCH: 30.8 pg (ref 26.6–33.0)
MCHC: 34 g/dL (ref 31.5–35.7)
MCV: 91 fL (ref 79–97)
MONOS ABS: 0.7 10*3/uL (ref 0.1–0.9)
Monocytes: 13 %
NEUTROS PCT: 76 %
Neutrophils Absolute: 4 10*3/uL (ref 1.4–7.0)
PLATELETS: 304 10*3/uL (ref 150–379)
RBC: 4.15 x10E6/uL (ref 3.77–5.28)
RDW: 14.6 % (ref 12.3–15.4)
WBC: 5.3 10*3/uL (ref 3.4–10.8)

## 2016-12-10 ENCOUNTER — Telehealth: Payer: Self-pay | Admitting: *Deleted

## 2016-12-10 NOTE — Telephone Encounter (Signed)
-----   Message from Britt Bottom, MD sent at 12/08/2016  3:20 PM EDT ----- Please let the patient know that the lab work is fine.  Low lymphocytes expected with Gilenya

## 2016-12-10 NOTE — Telephone Encounter (Signed)
Pt. aware of lab results as below/fim

## 2017-03-11 ENCOUNTER — Telehealth: Payer: Self-pay | Admitting: Neurology

## 2017-03-11 MED ORDER — METHYLPHENIDATE HCL 20 MG PO TABS: ORAL_TABLET | ORAL | 0 refills | Status: DC

## 2017-03-11 NOTE — Telephone Encounter (Signed)
Rx. awaiting RAS sig/fim 

## 2017-03-11 NOTE — Addendum Note (Signed)
Addended by: France Ravens I on: 03/11/2017 09:44 AM   Modules accepted: Orders

## 2017-03-11 NOTE — Telephone Encounter (Signed)
Rx. up front [facility]/fim 

## 2017-03-11 NOTE — Telephone Encounter (Signed)
Patient called office requesting refill for methylphenidate (RITALIN) 20 MG tablet.

## 2017-03-19 ENCOUNTER — Other Ambulatory Visit: Payer: Self-pay | Admitting: Neurology

## 2017-04-11 ENCOUNTER — Telehealth: Payer: Self-pay | Admitting: Neurology

## 2017-04-11 MED ORDER — METHYLPHENIDATE HCL 20 MG PO TABS: ORAL_TABLET | ORAL | 0 refills | Status: DC

## 2017-04-11 NOTE — Telephone Encounter (Signed)
Pt calling for refill of methylphenidate (RITALIN) 20 MG tablet

## 2017-04-11 NOTE — Telephone Encounter (Signed)
Rx. up front [facility]/fim 

## 2017-04-11 NOTE — Addendum Note (Signed)
Addended by: France Ravens I on: 04/11/2017 10:51 AM   Modules accepted: Orders

## 2017-04-11 NOTE — Telephone Encounter (Signed)
Rx. awaiting RAS sig/fim 

## 2017-05-07 ENCOUNTER — Telehealth: Payer: Self-pay | Admitting: Neurology

## 2017-05-07 NOTE — Telephone Encounter (Signed)
I have explained to Dawn Keith that she must continue to see Dr. Felecia Shelling in order for him to be able to rx. Gilenya/fim

## 2017-05-07 NOTE — Telephone Encounter (Signed)
I have spoken with Dawn Keith this morning.  She wanted to know if she would still be able to get Gilenya if she has a becomes uninsured, and I have explained that if this happens, we will try to get her signed up for [employer] free drug program.  She verbalized understanding of same/fim

## 2017-05-07 NOTE — Telephone Encounter (Signed)
Patient calling back and wants to know if she can just have lab work done and not see Dr. Felecia Shelling?

## 2017-05-07 NOTE — Telephone Encounter (Signed)
Pt is asking for a call back to discuss medications

## 2017-05-09 ENCOUNTER — Telehealth: Payer: Self-pay | Admitting: Neurology

## 2017-05-09 NOTE — Telephone Encounter (Signed)
I have spoken with Dawn Keith. She wanted to know if med. records can be sent to her psychiatrist and I have explained they can be sent once she signs a med rec. release/fim

## 2017-05-09 NOTE — Telephone Encounter (Signed)
Pt is asking for a call back from RN Faith re: information that her phyciastrist needs, pt also has information that she would like to discuss with RN Faith for herself as well.  Please call

## 2017-05-16 ENCOUNTER — Telehealth: Payer: Self-pay | Admitting: Neurology

## 2017-05-16 NOTE — Telephone Encounter (Signed)
Pt husband (on [place]) calling asking if there is anyway possible for pt to be seen before 9-18 due to extreme behavioral issues he is having with pt.  Pt is already on the wait list.  Pt husband states he is not sure if pt behavior has to do with medication dosage or what but he is very much wanting pt to be seen right away

## 2017-05-16 NOTE — Telephone Encounter (Signed)
LMOM for Bill that RAS can see Temesha next Wed. 05/22/17, arrival time of 1pm.  He does not need to return this call unless they are not able to keep this appt/fim

## 2017-05-22 ENCOUNTER — Telehealth: Payer: Self-pay | Admitting: Neurology

## 2017-05-22 ENCOUNTER — Encounter: Payer: Self-pay | Admitting: *Deleted

## 2017-05-22 ENCOUNTER — Encounter: Payer: Self-pay | Admitting: Neurology

## 2017-05-22 ENCOUNTER — Ambulatory Visit (INDEPENDENT_AMBULATORY_CARE_PROVIDER_SITE_OTHER): Payer: Managed Care, Other (non HMO) | Admitting: Neurology

## 2017-05-22 DIAGNOSIS — F329 Major depressive disorder, single episode, unspecified: Secondary | ICD-10-CM

## 2017-05-22 DIAGNOSIS — R269 Unspecified abnormalities of gait and mobility: Secondary | ICD-10-CM

## 2017-05-22 DIAGNOSIS — G35 Multiple sclerosis: Secondary | ICD-10-CM | POA: Diagnosis not present

## 2017-05-22 DIAGNOSIS — F09 Unspecified mental disorder due to known physiological condition: Secondary | ICD-10-CM

## 2017-05-22 DIAGNOSIS — F482 Pseudobulbar affect: Secondary | ICD-10-CM | POA: Diagnosis not present

## 2017-05-22 DIAGNOSIS — N3942 Incontinence without sensory awareness: Secondary | ICD-10-CM

## 2017-05-22 DIAGNOSIS — R5383 Other fatigue: Secondary | ICD-10-CM

## 2017-05-22 DIAGNOSIS — F32A Depression, unspecified: Secondary | ICD-10-CM

## 2017-05-22 MED ORDER — METHYLPHENIDATE HCL 20 MG PO TABS: ORAL_TABLET | ORAL | 0 refills | Status: DC

## 2017-05-22 NOTE — Progress Notes (Signed)
[facility]  PATIENT: Dawn Keith DOB: May 26, 1961  REFERRING DOCTOR OR PCP:  None  SOURCE: patient  _________________________________   HISTORICAL  CHIEF COMPLAINT:  Chief Complaint  Patient presents with  . Multiple Sclerosis    HISTORY OF PRESENT ILLNESS:  Dawn Keith is a 57 yo woman with MS.      MS:    She feels her MS has been mostly stable.     She is on Gilenya and tolerates it well.   She has had no definite exacerbations on it though has had mild worsening of gait over time.     Gait/Strength/Sensation:   She is noting some problems with her gait and fell in the dark recently.     She mostly uses her cane but uses a walker when she feels worse or tired.      Her right side is mildly weaker and mildly clumsier than the left and is mildly spastic.  She denies numbness and tingling in the limbs.      Bladder:  She has frequency and urgency occasioanl incontinence, helped by oxybutynin.   There is no hesitancy and she thinks she empties her bladder well. There have not been recent urinary tract infections.  Vision:   She reports no new visual symptoms.     She is scheduled to see ophthalmology soon for her annual exam.  Fatigue/sleep:   She notes fatigue that worsens with heat and as the day goes on.    If she is more active, she needs to rest.    Ritalin has helped her fatigue but she has not taken any for the last month when she ran out.   She tolerates it well    She is sleeping better on current medications.  She still worries a lot about the next day and family issues.    If she wakes up after a few hours of sleep that she may not be on fall back asleep again.  Mood/Cognition:   She feels mood is better on lamotrigine and Nuedexta.   She is having marital issues and stress.   She also reports difficulty dealing with her husband's family.  Although she is stressed, she has fewer outbursts.    Celexa and Buspar also have helped.   She was seeing Psych  but can't afford to continue due to copays.    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      MS history:  She was diagnosed with MS in 2012 after several years of worsening gait.      She started on Gilenya and has tolerated it well and has had no defiinite exacerbation but has worsened.   Her last MRI was 2015 at [place] [facility].        REVIEW OF SYSTEMS: Constitutional: No fevers, chills, sweats, or change in appetite.  Notes fatigue Eyes: No visual changes, double vision, eye pain Ear, nose and throat: No hearing loss, ear pain, nasal congestion, sore throat Cardiovascular: No chest pain, palpitations Respiratory: No shortness of breath at rest or with exertion.   No wheezes.   Pneumonia in 2017 GastrointestinaI: No nausea, vomiting, diarrhea, abdominal pain, fecal incontinence Genitourinary: as above Musculoskeletal: No neck pain, back pain Integumentary: No rash, pruritus, skin lesions Neurological: as above Psychiatric: as above Endocrine: No palpitations, diaphoresis, change in appetite, change in weigh or increased thirst Hematologic/Lymphatic: No anemia, purpura,  petechiae. Allergic/Immunologic: No itchy/runny eyes, nasal congestion, recent allergic reactions, rashes  ALLERGIES: Allergies  Allergen Reactions  . Donepezil     "makes me mean"  . Latex     HOME MEDICATIONS:  Current Outpatient Prescriptions:  .  busPIRone (BUSPAR) 15 MG tablet, Take 1 tablet (15 mg total) by mouth 2 (two) times daily., Disp: 180 tablet, Rfl: 3 .  Cholecalciferol 5000 UNITS TABS, Take 5,000 Units by mouth daily. , Disp: , Rfl:  .  citalopram (CELEXA) 40 MG tablet, Take 1 tablet (40 mg total) by mouth daily., Disp: 90 tablet, Rfl: 3 .  Cyanocobalamin (VITAMIN B-12) 2500 MCG TABS, Take 2,500 mcg by mouth daily., Disp: 30 tablet, Rfl: 5 .  DOCOSAHEXAENOIC ACID PO, Take by mouth., Disp: , Rfl:  .  Docusate Calcium  (STOOL SOFTENER PO), Take 1 tablet by mouth at bedtime., Disp: , Rfl:  .  GILENYA 0.5 MG CAPS, TAKE 1 CAPSULE BY MOUTH DAILY, Disp: 90 capsule, Rfl: 2 .  lamoTRIgine (LAMICTAL) 100 MG tablet, Take 1 tablet twice daily, Disp: 180 tablet, Rfl: 3 .  levothyroxine (SYNTHROID, LEVOTHROID) 75 MCG tablet, , Disp: , Rfl:  .  lovastatin (MEVACOR) 10 MG tablet, Take 10 mg by mouth at bedtime., Disp: , Rfl:  .  methylphenidate (RITALIN) 20 MG tablet, Take one tablet in the morning and 1/2 tablet at noon., Disp: 45 tablet, Rfl: 0 .  Naproxen Sodium 220 MG CAPS, Take by mouth., Disp: , Rfl:  .  NUEDEXTA 20-10 MG CAPS, Take 20 mg by mouth 2 (two) times daily., Disp: 180 capsule, Rfl: 3 .  oxybutynin (DITROPAN) 5 MG tablet, Take 1 tablet (5 mg total) by mouth 2 (two) times daily., Disp: 180 tablet, Rfl: 3  PAST MEDICAL HISTORY: Past Medical History:  Diagnosis Date  . Depressed   . Hyperlipemia   . Movement disorder   . MS (multiple sclerosis) ([place])   . Vision abnormalities     PAST SURGICAL HISTORY: Past Surgical History:  Procedure Laterality Date  . ABDOMINAL HYSTERECTOMY      FAMILY HISTORY: No family history on file.  SOCIAL HISTORY:  Social History   Social History  . Marital status: Married    Spouse name: N/A  . Number of children: N/A  . Years of education: N/A   Occupational History  . Not on file.   Social History Main Topics  . Smoking status: Never Smoker  . Smokeless tobacco: Never Used  . Alcohol use No  . Drug use: No  . Sexual activity: No   Other Topics Concern  . Not on file   Social History Narrative  . No narrative on file     PHYSICAL EXAM  There were no vitals filed for this visit.  There is no height or weight on file to calculate BMI.   General: The patient is well-developed and well-nourished and in no acute distress.      Skin: Extremities are without rash or significant edema.  Musculoskeletal:  Back is nontender  Neurologic  Exam  Mental status: The patient is alert and oriented x 3 at the time of the examination. There is mild distractibility but she is able to stay on topic for most of the history. She has mild reduced memory and attention, no worse than earlier this year.    Speech is normal.  Cranial nerves: Extraocular movements are full.    She has normal facial strength and sensation. Trapezius strength is normal   No  obvious hearing deficits are noted.  Motor:  Muscle bulk is normal.   Tone is slightly increased in legs, R > L.. Strength is  5 / 5 in all 4 extremities.   Sensory: Sensory testing is intact to soft touch and vibration sensation in all 4 extremities.  Coordination: Finger-nose-finger is performed fairly well bilaterally. Heel-to-shin is reduced on the right.    Gait and station: Station is normal.   She has a mildly reduced stride and wide stance.   She walks better with a cane.. She is unable to do a tandem walk without holding on. Romberg is negative.   Reflexes: Deep tendon reflexes are increased in both legs.       DIAGNOSTIC DATA (LABS, IMAGING, TESTING) - I reviewed patient records, labs, notes, testing and imaging myself where available.     ASSESSMENT AND PLAN  Multiple sclerosis (HCC)  Abnormal gait  Depression, unspecified depression type  Urinary incontinence without sensory awareness  Other fatigue  Cognitive deficit secondary to multiple sclerosis (HCC)  Pseudobulbar affect   1.   Continue Gilenya. Check labs.  She does not want to do an MRI to evaluate the MS for financial reasons.   2.   Continue buspirone/citalopram/Nuedexta/lamotrigine for mood issues.      3.   Renew Methylphenidate for attention/focus and fatigue 4.   Due to a combination of physical and cognitive disabilities (pseudobulbar affect) and fatigue, she is permanently disabled.    I suggested that she try to contact a Education officer, museum she saw in the past or the [employer] to see about steps to get  disability.   5.   Return in 5 months or sooner if there are new or worsening neurologic symptoms.   Haruto Demaria A. Felecia Shelling, MD, PhD 0/73/7106, 2:69 PM Certified in Neurology, Clinical Neurophysiology, Sleep Medicine, Pain Medicine and Neuroimaging  [facility] [address] [place], [place] [address] [phone]

## 2017-05-22 NOTE — Telephone Encounter (Signed)
Pt forgot to ask RN about getting an order for [facility] thru [employer] to have homehealth to come by and give her medications, etc. She said to call her husband to discuss at [phone] during the morning hours ( he goes to bed around noon)

## 2017-05-23 ENCOUNTER — Telehealth: Payer: Self-pay | Admitting: *Deleted

## 2017-05-23 LAB — CBC WITH DIFFERENTIAL/PLATELET
Basophils Absolute: 0 10*3/uL (ref 0.0–0.2)
Basos: 0 %
EOS (ABSOLUTE): 0 10*3/uL (ref 0.0–0.4)
EOS: 0 %
HEMATOCRIT: 41.5 % (ref 34.0–46.6)
HEMOGLOBIN: 13.5 g/dL (ref 11.1–15.9)
IMMATURE GRANS (ABS): 0 10*3/uL (ref 0.0–0.1)
IMMATURE GRANULOCYTES: 0 %
LYMPHS ABS: 1.2 10*3/uL (ref 0.7–3.1)
LYMPHS: 29 %
MCH: 31 pg (ref 26.6–33.0)
MCHC: 32.5 g/dL (ref 31.5–35.7)
MCV: 95 fL (ref 79–97)
MONOCYTES: 0 %
Monocytes Absolute: 0 10*3/uL — ABNORMAL LOW (ref 0.1–0.9)
Neutrophils Absolute: 2.8 10*3/uL (ref 1.4–7.0)
Neutrophils: 71 %
Platelets: 328 10*3/uL (ref 150–379)
RBC: 4.36 x10E6/uL (ref 3.77–5.28)
RDW: 14.1 % (ref 12.3–15.4)
WBC: 3.9 10*3/uL (ref 3.4–10.8)

## 2017-05-23 LAB — HEPATIC FUNCTION PANEL
ALBUMIN: 4.8 g/dL (ref 3.5–5.5)
ALK PHOS: 57 IU/L (ref 39–117)
ALT: 22 IU/L (ref 0–32)
AST: 22 IU/L (ref 0–40)
BILIRUBIN, DIRECT: 0.07 mg/dL (ref 0.00–0.40)
Total Protein: 7 g/dL (ref 6.0–8.5)

## 2017-05-23 NOTE — Telephone Encounter (Signed)
-----   Message from Britt Bottom, MD sent at 05/23/2017  3:10 PM EDT ----- Please let the patient know that the lab work is fine.

## 2017-05-23 NOTE — Telephone Encounter (Signed)
Pt. aware lab work done in our office is fine/fim

## 2017-06-03 NOTE — Telephone Encounter (Signed)
Patient called office in reference to needing orders faxed to [facility] for homehealth services.  [facility] fax #- [phone].  Please call patient

## 2017-06-03 NOTE — Telephone Encounter (Signed)
I have spoken with Dawn Keith and Dawn Keith both today, to see what home health services they are requesting.  [name] doesn't have a specific need at this time, was just asking in general what does [name] qualify for.  She does not need nsg./pt right now./fim

## 2017-06-10 ENCOUNTER — Telehealth: Payer: Self-pay | Admitting: Neurology

## 2017-06-10 NOTE — Telephone Encounter (Signed)
I have spoken with Dawn Keith and confirmed that letter she received from Dr. Felecia Shelling does  indicate that she is totally disabled from working./fim

## 2017-06-10 NOTE — Telephone Encounter (Signed)
Patient needs to discuss letter she received from our office regarding having MS.

## 2017-06-11 ENCOUNTER — Ambulatory Visit: Payer: Managed Care, Other (non HMO) | Admitting: Neurology

## 2017-06-19 ENCOUNTER — Telehealth: Payer: Self-pay | Admitting: Neurology

## 2017-06-19 MED ORDER — BUSPIRONE HCL 15 MG PO TABS: 15.0000 mg | ORAL_TABLET | Freq: Two times a day (BID) | ORAL | 3 refills | Status: DC

## 2017-06-19 NOTE — Telephone Encounter (Addendum)
Pt called request refills for busPIRone (BUSPAR) 15 MG tablet sent to [place].  She will check with pharmacy this afternoon

## 2017-06-19 NOTE — Telephone Encounter (Signed)
Buspar escribed to [facility] as requested/fim

## 2017-06-28 NOTE — Telephone Encounter (Signed)
ERROR

## 2017-07-15 ENCOUNTER — Telehealth: Payer: Self-pay | Admitting: *Deleted

## 2017-07-15 ENCOUNTER — Telehealth: Payer: Self-pay | Admitting: Neurology

## 2017-07-15 MED ORDER — METHYLPHENIDATE HCL 20 MG PO TABS: ORAL_TABLET | ORAL | 0 refills | Status: DC

## 2017-07-15 NOTE — Telephone Encounter (Signed)
Rx's up front [facility]/fim 

## 2017-07-15 NOTE — Telephone Encounter (Addendum)
Pt request refill for methylphenidate (RITALIN) 20 MG tablet, 2 RX please. Her husband is dropping off FMLA this morning and wants to pick up RX also. Pt is aware it maynot be available

## 2017-07-15 NOTE — Telephone Encounter (Signed)
FMLA forms for Dawn Keith's husband, Dawn Keith, completed and sent up front to [place] in [employer] Records./fim

## 2017-07-23 ENCOUNTER — Telehealth: Payer: Self-pay | Admitting: Neurology

## 2017-07-23 NOTE — Telephone Encounter (Signed)
Pt has called back to inform RN Faith that she has found her Gilenya.  Pt would still like a call back

## 2017-07-23 NOTE — Telephone Encounter (Signed)
Pt has called to provide RN Faith with [phone] so that a verbal order can be called in for GILENYA 0.5 MG CAPS.  Pt states she had 2 boxes of the GILENYA 0.5 MG CAPS but has no idea as to where they are.  Pt states she is not supposed to be without this medication as to why she has asked this message be urgently sent to RN Faith, pt has asked to be called on her mobile if there are questions re: her request

## 2017-07-23 NOTE — Telephone Encounter (Signed)
I have spoken with Dawn Keith this morning.  She has her Gilenya (no lapse in therapy) and does not need a temporary rx. called to [employer] pt. assistance/fim

## 2017-08-27 ENCOUNTER — Telehealth: Payer: Self-pay

## 2017-08-27 NOTE — Telephone Encounter (Signed)
Patient called and says that she has misplaced her medical records and needs then reprinted again. Please call her at her home number

## 2017-09-19 ENCOUNTER — Other Ambulatory Visit: Payer: Self-pay | Admitting: Neurology

## 2017-09-19 ENCOUNTER — Telehealth: Payer: Self-pay | Admitting: Neurology

## 2017-09-19 NOTE — Telephone Encounter (Signed)
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 Said she can't afford to pay this. Pt is very upset, she has 29 days left. Pt is aware Dr Felecia Shelling and RN are out of the office until 09/25/17 but would like work in Therapist, sports to call.

## 2017-09-19 NOTE — Telephone Encounter (Signed)
I called the patient back and she is just she was getting financial assistance, but she told [place] information and Christella Scheuermann is now aware and they are stating she is going to have to pay deductible. Patient keeps repeating she screwed the insurance up. I questioned how could I be assistance and she doesn't think I can help. I informed her that we could certainly mention to our financial assistance program and see if there is any patient assistance programs that may help. Pt doesn't think that she may even be covered any longer because she called the insurance company and " ran her mouth" I explained that I dont know how to help her because this sounds like its between her and insurance company but that I can certainly reach out to our person here and maybe they can see if there is any financial assistance programs that can help her with obtaining her Gilenya

## 2017-09-26 ENCOUNTER — Telehealth: Payer: Self-pay | Admitting: Neurology

## 2017-09-26 NOTE — Telephone Encounter (Signed)
Yes Faith , Can help you the Nurses do the Gilenya Go start Program . Kyra Searles has start Form.

## 2017-09-26 NOTE — Telephone Encounter (Signed)
Error

## 2017-09-26 NOTE — Telephone Encounter (Signed)
I attempted to call pt. but mailbox is full.  She just needs to let [employer] know exactly what her insurance and employment statuses are.  They will then let her know what pt. assistance benefits she is eligible for./fim

## 2017-10-03 ENCOUNTER — Telehealth: Payer: Self-pay | Admitting: *Deleted

## 2017-10-03 MED ORDER — RITALIN 10 MG PO TABS: ORAL_TABLET | ORAL | 0 refills | Status: DC

## 2017-10-03 NOTE — Telephone Encounter (Signed)
Received request from [facility] to complete a PA for Ritalin 20mg  #45/30.  Per [employer], no PA for Ritalin required.  This is a covered med. [employer]'s response faxed to [facility] on [address]/fim

## 2017-10-03 NOTE — Telephone Encounter (Addendum)
[facility] called stating the medication does not need PA, it is on back order and they cannot get it. She said it only comes in 5mg ,10mg  and 20mg  tablets then to SR20mg  capsule and then LA 20, 30, 40 and 60mg . She is requesting a new RX for one of these or for a different medication.

## 2017-10-03 NOTE — Addendum Note (Signed)
Addended by: France Ravens I on: 10/03/2017 04:40 PM   Modules accepted: Orders

## 2017-10-03 NOTE — Telephone Encounter (Signed)
Per RAS, ok for Ritalin 10mg , 2 tabs in the am and 1 tablet at noon.  Rx. awaiting RAS sig/fim

## 2017-10-04 NOTE — Telephone Encounter (Signed)
I have spoken with the pharmacist at [facility] this am.  Since there is no change in daily dose of Adderall, he was able to accept verbal order for Adderall 10mg  po, 2 tabs in the am and 1 tab at noon. (Adderall 20mg  on back order).  Rx. that was printed yesterday has been destroyed/fim

## 2017-10-22 ENCOUNTER — Encounter: Payer: Self-pay | Admitting: Neurology

## 2017-10-22 ENCOUNTER — Ambulatory Visit: Payer: Managed Care, Other (non HMO) | Admitting: Neurology

## 2017-10-22 VITALS — BP 105/64 | HR 67 | Ht 67.0 in | Wt 118.5 lb

## 2017-10-22 DIAGNOSIS — R269 Unspecified abnormalities of gait and mobility: Secondary | ICD-10-CM

## 2017-10-22 DIAGNOSIS — R5383 Other fatigue: Secondary | ICD-10-CM

## 2017-10-22 DIAGNOSIS — G35 Multiple sclerosis: Secondary | ICD-10-CM

## 2017-10-22 DIAGNOSIS — G4700 Insomnia, unspecified: Secondary | ICD-10-CM | POA: Diagnosis not present

## 2017-10-22 DIAGNOSIS — F09 Unspecified mental disorder due to known physiological condition: Secondary | ICD-10-CM | POA: Diagnosis not present

## 2017-10-22 DIAGNOSIS — F418 Other specified anxiety disorders: Secondary | ICD-10-CM | POA: Diagnosis not present

## 2017-10-22 MED ORDER — LORAZEPAM 0.5 MG PO TABS: 0.5000 mg | ORAL_TABLET | Freq: Every day | ORAL | 3 refills | Status: DC

## 2017-10-22 MED ORDER — RITALIN 10 MG PO TABS: ORAL_TABLET | ORAL | 0 refills | Status: DC

## 2017-10-22 NOTE — Progress Notes (Signed)
[facility]  PATIENT: Dawn Keith DOB: Aug 09, 1961  REFERRING DOCTOR OR PCP:  None  SOURCE: patient  _________________________________   HISTORICAL  CHIEF COMPLAINT:  Chief Complaint  Patient presents with  . Multiple Sclerosis    Patient doing well.     HISTORY OF PRESENT ILLNESS:  Dawn Keith is a 57 yo woman with MS.      Update 10/22/2017: SHe feels her MS is stable.    She is on Gilenya and she tolerates it well.    Her gait is about the same.     She has  No recent falls.   Strength and sensation are unchanged and her right leg is still a little weaker than the left.  Her bladder function is doing.   Oxybutynin has helped.   Vision is ok.  She notes mild fatigue but no worsening.   Her right leg fatigues if she does a lot of activity.   She has anxiety at night and sleep onset is difficult.    Ritalin helps her focus and attention and fatigue during the day.   She is better on 40-20 than 20-20 mg.   Mood is helped by her med's.   Lamotrigine, Buspar and citalopram have helped her anxiety and depression.    From 05/22/2017: MS:    She feels her MS has been mostly stable.     She is on Gilenya and tolerates it well.   She has had no definite exacerbations on it though has had mild worsening of gait over time.     Gait/Strength/Sensation:   She is noting some problems with her gait and fell in the dark recently.     She mostly uses her cane but uses a walker when she feels worse or tired.      Her right side is mildly weaker and mildly clumsier than the left and is mildly spastic.  She denies numbness and tingling in the limbs.      Bladder:  She has frequency and urgency occasioanl incontinence, helped by oxybutynin.   There is no hesitancy and she thinks she empties her bladder well. There have not been recent urinary tract infections.  Vision:   She reports no new visual symptoms.     She is scheduled to see ophthalmology soon for her annual  exam.  Fatigue/sleep:   She notes fatigue that worsens with heat and as the day goes on.    If she is more active, she needs to rest.    Ritalin has helped her fatigue but she has not taken any for the last month when she ran out.   She tolerates it well    She is sleeping better on current medications.  She still worries a lot about the next day and family issues.    If she wakes up after a few hours of sleep that she may not be on fall back asleep again.  Mood/Cognition:   She feels mood is better on lamotrigine and Nuedexta.   She is having marital issues and stress.   She also reports difficulty dealing with her husband's family.  Although she is stressed, she has fewer outbursts.    Celexa and Buspar also have helped.   She was seeing Psych but can't afford to continue due to copays.    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      MS history:  She was diagnosed with MS in 2012 after several years of worsening gait.      She started on Gilenya and has tolerated it well and has had no defiinite exacerbation but has worsened.   Her last MRI was 2015 at [place] in [facility].        REVIEW OF SYSTEMS: Constitutional: No fevers, chills, sweats, or change in appetite.  Notes fatigue Eyes: No visual changes, double vision, eye pain Ear, nose and throat: No hearing loss, ear pain, nasal congestion, sore throat Cardiovascular: No chest pain, palpitations Respiratory: No shortness of breath at rest or with exertion.   No wheezes.   Pneumonia in 2017 GastrointestinaI: No nausea, vomiting, diarrhea, abdominal pain, fecal incontinence Genitourinary: as above Musculoskeletal: No neck pain, back pain Integumentary: No rash, pruritus, skin lesions Neurological: as above Psychiatric: as above Endocrine: No palpitations, diaphoresis, change in appetite, change in weigh or increased thirst Hematologic/Lymphatic: No anemia, purpura,  petechiae. Allergic/Immunologic: No itchy/runny eyes, nasal congestion, recent allergic reactions, rashes  ALLERGIES: Allergies  Allergen Reactions  . Donepezil     "makes me mean"  . Latex     HOME MEDICATIONS:  Current Outpatient Medications:  .  busPIRone (BUSPAR) 15 MG tablet, Take 1 tablet (15 mg total) by mouth 2 (two) times daily., Disp: 180 tablet, Rfl: 3 .  Cholecalciferol 5000 UNITS TABS, Take 5,000 Units by mouth daily. , Disp: , Rfl:  .  citalopram (CELEXA) 40 MG tablet, Take 1 tablet (40 mg total) by mouth daily., Disp: 90 tablet, Rfl: 3 .  Cyanocobalamin (VITAMIN B-12) 2500 MCG TABS, Take 2,500 mcg by mouth daily., Disp: 30 tablet, Rfl: 5 .  DOCOSAHEXAENOIC ACID PO, Take by mouth., Disp: , Rfl:  .  Docusate Calcium (STOOL SOFTENER PO), Take 1 tablet by mouth at bedtime., Disp: , Rfl:  .  GILENYA 0.5 MG CAPS, TAKE 1 CAPSULE BY MOUTH DAILY, Disp: 90 capsule, Rfl: 3 .  lamoTRIgine (LAMICTAL) 100 MG tablet, Take 1 tablet twice daily, Disp: 180 tablet, Rfl: 3 .  levothyroxine (SYNTHROID, LEVOTHROID) 75 MCG tablet, , Disp: , Rfl:  .  lovastatin (MEVACOR) 10 MG tablet, Take 10 mg by mouth at bedtime., Disp: , Rfl:  .  Naproxen Sodium 220 MG CAPS, Take by mouth., Disp: , Rfl:  .  NUEDEXTA 20-10 MG CAPS, Take 20 mg by mouth 2 (two) times daily., Disp: 180 capsule, Rfl: 3 .  oxybutynin (DITROPAN) 5 MG tablet, Take 1 tablet (5 mg total) by mouth 2 (two) times daily., Disp: 180 tablet, Rfl: 3 .  RITALIN 10 MG tablet, Methylphenidate 10 mg.   Take 2 tablets in the morning and 1 tablet at noon., Disp: 90 tablet, Rfl: 0 .  LORazepam (ATIVAN) 0.5 MG tablet, Take 1 tablet (0.5 mg total) by mouth at bedtime., Disp: 30 tablet, Rfl: 3  PAST MEDICAL HISTORY: Past Medical History:  Diagnosis Date  . Depressed   . Hyperlipemia   . Movement disorder   . MS (multiple sclerosis) ([place])   . Vision abnormalities     PAST SURGICAL HISTORY: Past Surgical History:  Procedure Laterality Date   . ABDOMINAL HYSTERECTOMY      FAMILY HISTORY: No family history on file.  SOCIAL HISTORY:  Social History   Socioeconomic History  . Marital status: Married    Spouse name: Not on file  . Number of children: Not on file  . Years of education: Not on file  . Highest education level: Not on file  Social Needs  . Financial resource strain: Not on file  . Food insecurity - worry: Not on file  . Food insecurity - inability: Not on file  . Transportation needs - medical: Not on file  . Transportation needs - non-medical: Not on file  Occupational History  . Not on file  Tobacco Use  . Smoking status: Never Smoker  . Smokeless tobacco: Never Used  Substance and Sexual Activity  . Alcohol use: No  . Drug use: No  . Sexual activity: No    Birth control/protection: Surgical  Other Topics Concern  . Not on file  Social History Narrative  . Not on file     PHYSICAL EXAM  Vitals:   10/22/17 1255  BP: 105/64  Pulse: 67  Weight: 118 lb 8 oz (53.8 kg)  Height: 5\' 7"  (1.702 m)    Body mass index is 18.56 kg/m.    General: The patient is well-developed and well-nourished and in no acute distress.      Skin: Extremities are without rash or significant edema.   Neurologic Exam  Mental status: The patient is alert and oriented x 3 at the time of the examination. There is mild distractibility but she is able to stay on topic for most of the history. She has mild reduced memory and attention, no worse than earlier this year.    Speech is normal.  Cranial nerves: Extraocular movements are full.    She has normal facial strength and sensation. Trapezius strength is normal   No obvious hearing deficits are noted.  Motor:  Muscle bulk is normal.   Tone is mildly increased right > than left. However, strength is 5/5.  Sensory: Sensory testing is intact to soft touch and vibration sensation in all 4 extremities.  Coordination: Finger-nose-finger is performed fairly well  bilaterally. Heel-to-shin is reduced on the right.    Gait and station: Station is normal.   The stride is reduced and the stance is wide. She is able to walk without a cane. She cannot do a tandem walk.. Romberg is negative.   Reflexes: Deep tendon reflexes are increased in both legs.        DIAGNOSTIC DATA (LABS, IMAGING, TESTING) - I reviewed patient records, labs, notes, testing and imaging myself where available.     ASSESSMENT AND PLAN  Multiple sclerosis (HCC)  Abnormal gait  Other fatigue  Insomnia, unspecified type  Cognitive deficit secondary to multiple sclerosis ([place])  Depression with anxiety     1.   She will continue Gilenya. For financial reasons, she prefers not to do follow-up MRI. 2.   Continue buspirone/citalopram/Nuedexta/lamotrigine for mood issues.    To help with insomnia, low-dose Ativan at night.   3.   Renew Methylphenidate for attention/focus and fatigue 4.   Due to a combination of physical and cognitive disabilities (pseudobulbar affect) and fatigue, she is permanently disabled.      5.   Return in 4-5 months or sooner if there are new or worsening neurologic symptoms.   Richard A. Felecia Shelling, MD, PhD 4/69/6295, 2:84 PM Certified in Neurology, Clinical Neurophysiology, Sleep Medicine, Pain Medicine and Neuroimaging  [facility] [address] [place], [place] [address] [phone]

## 2017-12-10 ENCOUNTER — Telehealth: Payer: Self-pay | Admitting: Neurology

## 2017-12-10 NOTE — Telephone Encounter (Signed)
Attorney Kwame Opata(no DPR) called 3:54pm asking if Dr Felecia Shelling were available to speak with.  He was advised that Dr Felecia Shelling and his RN are with patients.  He states pt is needed to testify in court but he does not feel pt is in a suitable state to do so.  He is asking if Dr Felecia Shelling would prepare a document stating it is not recommended/suggested for pt to be expected to testify.  Dawn Keith was informed the message would be sent, he is asking for a call on his cellular [phone]

## 2017-12-11 NOTE — Telephone Encounter (Signed)
We have no signed release allowing Dr. Felecia Shelling or office staff to communicate with this attorney, so call will not be returned./fim

## 2017-12-12 ENCOUNTER — Other Ambulatory Visit: Payer: Self-pay | Admitting: Neurology

## 2017-12-14 ENCOUNTER — Other Ambulatory Visit: Payer: Self-pay | Admitting: Neurology

## 2017-12-16 ENCOUNTER — Telehealth: Payer: Self-pay | Admitting: Neurology

## 2017-12-16 MED ORDER — LAMOTRIGINE 100 MG PO TABS: ORAL_TABLET | ORAL | 3 refills | Status: DC

## 2017-12-16 NOTE — Telephone Encounter (Signed)
Pt called to advise she is out of lamoTRIgine (LAMICTAL) 100 MG tablet. Refill has been requested from [place]. She will check back with pharmacy later this afternoon  FYI

## 2017-12-16 NOTE — Telephone Encounter (Signed)
Lamictal escribed to [facility] as requested/fim

## 2017-12-19 NOTE — Telephone Encounter (Signed)
FYI The office of [place] just called again asking for a letter stating pt is not competent enough to testify in court.  The representative from the office was advised that pt will need to fill out a revised DPR in the office adding their office on there in order for the office to be spoke with.  The representative  Stated ok and the call ended.

## 2017-12-19 NOTE — Telephone Encounter (Signed)
Noted/fim 

## 2018-01-10 ENCOUNTER — Other Ambulatory Visit: Payer: Self-pay | Admitting: Neurology

## 2018-01-15 ENCOUNTER — Telehealth: Payer: Self-pay | Admitting: Neurology

## 2018-01-15 NOTE — Telephone Encounter (Signed)
Called Attorney Opata at [phone] at 430 pm (Patient has signed an authorization to release protected health info)  No answer or voicemail.    I will try again tomorrow.

## 2018-01-16 ENCOUNTER — Encounter: Payer: Self-pay | Admitting: Neurology

## 2018-01-16 NOTE — Telephone Encounter (Signed)
I spoke to attorney Coralyn Mark.   He is requesting a letter with my opinion as to whether Dawn Keith is able to testify in a court proceedings / deposition.   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  I will send a letter to him at [place]. SteHelene Kelp, [place]  His phone number was [phone]

## 2018-01-28 ENCOUNTER — Other Ambulatory Visit: Payer: Self-pay | Admitting: Neurology

## 2018-02-10 ENCOUNTER — Other Ambulatory Visit: Payer: Self-pay | Admitting: Neurology

## 2018-02-10 MED ORDER — RITALIN 10 MG PO TABS: ORAL_TABLET | ORAL | 0 refills | Status: DC

## 2018-02-10 NOTE — Telephone Encounter (Signed)
Rx registry checked and last fill on her Ritalin was 12/17/17 for qty 90. Last OV was 10/22/17 and next OV is 02/19/18. Pharmacy confirmed.

## 2018-02-10 NOTE — Addendum Note (Signed)
Addended by: Belinda Block A on: 02/10/2018 09:01 AM   Modules accepted: Orders

## 2018-02-10 NOTE — Telephone Encounter (Signed)
Patient requesting refill of RITALIN 10 MG tablet sent to [facility] on [address].

## 2018-02-11 ENCOUNTER — Telehealth: Payer: Self-pay | Admitting: Neurology

## 2018-02-11 NOTE — Telephone Encounter (Signed)
Spoke with pharmacist at [facility].  Ritalin rx. was written DAW.  They simply needed ok to fill generic. Verbal rx. for generic given/fim

## 2018-02-11 NOTE — Telephone Encounter (Signed)
Pt states she was told by [facility] that her insurance will not pay for the last prescription received for   RITALIN 10 MG tablet   Please call pt  Re: what can be done about her continuation on the   RITALIN 10 MG tablet

## 2018-02-19 ENCOUNTER — Ambulatory Visit: Payer: Managed Care, Other (non HMO) | Admitting: Neurology

## 2018-02-20 ENCOUNTER — Encounter: Payer: Self-pay | Admitting: Neurology

## 2018-03-03 ENCOUNTER — Telehealth: Payer: Self-pay | Admitting: Neurology

## 2018-03-03 NOTE — Telephone Encounter (Signed)
The patient fell on 02 March 2018, sustained a laceration to her head area the patient required staples that were done through an urgent medical care.  The patient did not lose consciousness with the fall, she was not confused afterwards, she is not on any anticoagulant medications.  I have recommended conservative observation, if any increase in headache, lethargy, vomiting, or confusion and Soo, they are to go to the emergency room.

## 2018-03-04 ENCOUNTER — Telehealth: Payer: Self-pay | Admitting: Neurology

## 2018-03-04 NOTE — Telephone Encounter (Signed)
Pt is asking for a call from [place], she states she has a Gilenya question.  Please call

## 2018-03-04 NOTE — Telephone Encounter (Signed)
Spoke with Opal Sidles. She noshowed last ov; has r/s for 6/27 and just wanted to make sure Dr. Felecia Shelling will r/f Gilenya until then if needed, and I have explained that he will/fim

## 2018-03-06 ENCOUNTER — Telehealth: Payer: Self-pay | Admitting: Neurology

## 2018-03-06 NOTE — Telephone Encounter (Signed)
Ok.  OV notes placed in mail to her home address/fim

## 2018-03-06 NOTE — Telephone Encounter (Signed)
Spoke with Dawn Keith who sts. she needs a letter detailing her emotional, physical and cognitive disabilities.  I have explained these are best documented w/i an office note, so notes from her last ov on 1/29 would be helpful.  She verbalized understanding of same.  I have mailed notes from her 1/29 appt. with [facility] to her home address per her request/fim

## 2018-03-06 NOTE — Telephone Encounter (Signed)
Pt requesting a call back, stating she is needing Dr. Felecia Shelling to write a letter for her. Pt didn't want to discuss further with me please call to advise

## 2018-03-06 NOTE — Telephone Encounter (Signed)
Pt called wanting to make sure the letter will be mailed to her at her current home address. Stating she though she said to leave it up front but wants it mailed.

## 2018-03-20 ENCOUNTER — Encounter: Payer: Self-pay | Admitting: Neurology

## 2018-03-20 ENCOUNTER — Ambulatory Visit: Payer: Managed Care, Other (non HMO) | Admitting: Neurology

## 2018-03-20 ENCOUNTER — Other Ambulatory Visit: Payer: Self-pay

## 2018-03-20 VITALS — BP 108/67 | HR 75 | Resp 20 | Ht 67.0 in | Wt 117.5 lb

## 2018-03-20 DIAGNOSIS — F482 Pseudobulbar affect: Secondary | ICD-10-CM

## 2018-03-20 DIAGNOSIS — G35 Multiple sclerosis: Secondary | ICD-10-CM

## 2018-03-20 DIAGNOSIS — F418 Other specified anxiety disorders: Secondary | ICD-10-CM

## 2018-03-20 DIAGNOSIS — F329 Major depressive disorder, single episode, unspecified: Secondary | ICD-10-CM

## 2018-03-20 DIAGNOSIS — G4700 Insomnia, unspecified: Secondary | ICD-10-CM | POA: Diagnosis not present

## 2018-03-20 DIAGNOSIS — R269 Unspecified abnormalities of gait and mobility: Secondary | ICD-10-CM

## 2018-03-20 DIAGNOSIS — F09 Unspecified mental disorder due to known physiological condition: Secondary | ICD-10-CM

## 2018-03-20 DIAGNOSIS — F32A Depression, unspecified: Secondary | ICD-10-CM

## 2018-03-20 MED ORDER — LAMOTRIGINE 150 MG PO TABS: ORAL_TABLET | ORAL | 3 refills | Status: DC

## 2018-03-20 MED ORDER — LORAZEPAM 0.5 MG PO TABS: 0.5000 mg | ORAL_TABLET | Freq: Every day | ORAL | 5 refills | Status: DC

## 2018-03-20 MED ORDER — RITALIN 10 MG PO TABS: ORAL_TABLET | ORAL | 0 refills | Status: DC

## 2018-03-20 NOTE — Progress Notes (Signed)
[facility]  PATIENT: Dawn Keith DOB: [birth year]  REFERRING DOCTOR OR PCP:  None  SOURCE: patient  _________________________________   HISTORICAL  CHIEF COMPLAINT:  Chief Complaint  Patient presents with  . Multiple Sclerosis    Sts. she continues to tolerate Gilenya well.  Dtr. Wilburn Cornelia is with her today/fim    HISTORY OF PRESENT ILLNESS:  Dawn Keith is a 57 yo woman with MS.    Update 03/20/2018: She is doing well on Gilenya with no new exacerbations.    She denies any exacerbations.     She feels her gait is doing well.   She stumbles frequently and has had a few falls.    She had a small foot/ankle fracture and had to wear a boot x a month.    She gets dizzy if she turns rapidly.    She feels strength is about the same with some leg weakness.   If she falls she has trouble getting back up.   She denies numbness or painful tingling for the most part but sometimes has right hand numbness.    She has a lot of urinary frequency and urgency.   Oxybutynin has helped.     Vision is fine.  She has mild fatigue and has poor sleep.   She has difficulty with sleep onset and sleep maintenance.     She only takes the lorazepam prn.    She has depression and anxiety.    She often feels overwhelmed.    She has done better since the lamotrigine was added.    She is also on Buspar and citalopram.   Ritalin has helped her cognitive issues.      Update 10/22/2017: SHe feels her MS is stable.    She is on Gilenya and she tolerates it well.    Her gait is about the same.     She has  No recent falls.   Strength and sensation are unchanged and her right leg is still a little weaker than the left.  Her bladder function is doing.   Oxybutynin has helped.   Vision is ok.  She notes mild fatigue but no worsening.   Her right leg fatigues if she does a lot of activity.   She has anxiety at night and sleep onset is difficult.    Ritalin helps her focus and attention and fatigue during the  day.   She is better on 40-20 than 20-20 mg.   Mood is helped by her med's.   Lamotrigine, Buspar and citalopram have helped her anxiety and depression.    From 05/22/2017: MS:    She feels her MS has been mostly stable.     She is on Gilenya and tolerates it well.   She has had no definite exacerbations on it though has had mild worsening of gait over time.     Gait/Strength/Sensation:   She is noting some problems with her gait and fell in the dark recently.     She mostly uses her cane but uses a walker when she feels worse or tired.      Her right side is mildly weaker and mildly clumsier than the left and is mildly spastic.  She denies numbness and tingling in the limbs.      Bladder:  She has frequency and urgency occasioanl incontinence, helped by oxybutynin.   There is no hesitancy and she thinks she empties her bladder well. There have not been recent urinary  tract infections.  Vision:   She reports no new visual symptoms.     She is scheduled to see ophthalmology soon for her annual exam.  Fatigue/sleep:   She notes fatigue that worsens with heat and as the day goes on.    If she is more active, she needs to rest.    Ritalin has helped her fatigue but she has not taken any for the last month when she ran out.   She tolerates it well    She is sleeping better on current medications.  She still worries a lot about the next day and family issues.    If she wakes up after a few hours of sleep that she may not be on fall back asleep again.  Mood/Cognition:   She feels mood is better on lamotrigine and Nuedexta.   She is having marital issues and stress.   She also reports difficulty dealing with her husband's family.  Although she is stressed, she has fewer outbursts.    Celexa and Buspar also have helped.   She was seeing Psych but can't afford to continue due to copays.    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      MS history:  She was  diagnosed with MS in 2012 after several years of worsening gait.      She started on Gilenya and has tolerated it well and has had no defiinite exacerbation but has worsened.   Her last MRI was 2015 at [facility].        REVIEW OF SYSTEMS: Constitutional: No fevers, chills, sweats, or change in appetite.  Notes fatigue Eyes: No visual changes, double vision, eye pain Ear, nose and throat: No hearing loss, ear pain, nasal congestion, sore throat Cardiovascular: No chest pain, palpitations Respiratory: No shortness of breath at rest or with exertion.   No wheezes.   Pneumonia in 2017 GastrointestinaI: No nausea, vomiting, diarrhea, abdominal pain, fecal incontinence Genitourinary: as above Musculoskeletal: No neck pain, back pain Integumentary: No rash, pruritus, skin lesions Neurological: as above Psychiatric: as above Endocrine: No palpitations, diaphoresis, change in appetite, change in weigh or increased thirst Hematologic/Lymphatic: No anemia, purpura, petechiae. Allergic/Immunologic: No itchy/runny eyes, nasal congestion, recent allergic reactions, rashes  ALLERGIES: Allergies  Allergen Reactions  . Donepezil     "makes me mean"  . Latex     HOME MEDICATIONS:  Current Outpatient Medications:  .  busPIRone (BUSPAR) 15 MG tablet, Take 1 tablet (15 mg total) by mouth 2 (two) times daily., Disp: 180 tablet, Rfl: 3 .  Cholecalciferol 5000 UNITS TABS, Take 5,000 Units by mouth daily. , Disp: , Rfl:  .  citalopram (CELEXA) 40 MG tablet, TAKE 1 TABLET BY MOUTH ONCE DAILY, Disp: 90 tablet, Rfl: 3 .  Cyanocobalamin (VITAMIN B-12) 2500 MCG TABS, Take 2,500 mcg by mouth daily., Disp: 30 tablet, Rfl: 5 .  DOCOSAHEXAENOIC ACID PO, Take by mouth., Disp: , Rfl:  .  Docusate Calcium (STOOL SOFTENER PO), Take 1 tablet by mouth at bedtime., Disp: , Rfl:  .  GILENYA 0.5 MG CAPS, TAKE 1 CAPSULE BY MOUTH DAILY, Disp: 90 capsule, Rfl: 3 .  lamoTRIgine (LAMICTAL) 150 MG  tablet, Take 1 tablet twice daily, Disp: 180 tablet, Rfl: 3 .  levothyroxine (SYNTHROID, LEVOTHROID) 75 MCG tablet, , Disp: , Rfl:  .  LORazepam (ATIVAN) 0.5 MG tablet, Take 1 tablet (0.5 mg total) by  mouth at bedtime., Disp: 30 tablet, Rfl: 5 .  lovastatin (MEVACOR) 10 MG tablet, Take 10 mg by mouth at bedtime., Disp: , Rfl:  .  Naproxen Sodium 220 MG CAPS, Take by mouth., Disp: , Rfl:  .  NUEDEXTA 20-10 MG CAPS, TAKE 1 CAPSULE BY MOUTH TWICE DAILY, Disp: 60 capsule, Rfl: 11 .  oxybutynin (DITROPAN) 5 MG tablet, TAKE 1 TABLET BY MOUTH TWICE DAILY, Disp: 180 tablet, Rfl: 0 .  RITALIN 10 MG tablet, Methylphenidate 10 mg.   Take 2 tablets in the morning and 1 tablet at noon., Disp: 90 tablet, Rfl: 0  PAST MEDICAL HISTORY: Past Medical History:  Diagnosis Date  . Depressed   . Hyperlipemia   . Movement disorder   . MS (multiple sclerosis) ([place])   . Vision abnormalities     PAST SURGICAL HISTORY: Past Surgical History:  Procedure Laterality Date  . ABDOMINAL HYSTERECTOMY      FAMILY HISTORY: History reviewed. No pertinent family history.  SOCIAL HISTORY:  Social History   Socioeconomic History  . Marital status: Married    Spouse name: Not on file  . Number of children: Not on file  . Years of education: Not on file  . Highest education level: Not on file  Occupational History  . Not on file  Social Needs  . Financial resource strain: Not on file  . Food insecurity:    Worry: Not on file    Inability: Not on file  . Transportation needs:    Medical: Not on file    Non-medical: Not on file  Tobacco Use  . Smoking status: Never Smoker  . Smokeless tobacco: Never Used  Substance and Sexual Activity  . Alcohol use: No  . Drug use: No  . Sexual activity: Never    Birth control/protection: Surgical  Lifestyle  . Physical activity:    Days per week: Not on file    Minutes per session: Not on file  . Stress: Not on file  Relationships  . Social connections:    Talks  on phone: Not on file    Gets together: Not on file    Attends religious service: Not on file    Active member of club or organization: Not on file    Attends meetings of clubs or organizations: Not on file    Relationship status: Not on file  . Intimate partner violence:    Fear of current or ex partner: Not on file    Emotionally abused: Not on file    Physically abused: Not on file    Forced sexual activity: Not on file  Other Topics Concern  . Not on file  Social History Narrative  . Not on file     PHYSICAL EXAM  Vitals:   03/20/18 1529  BP: 108/67  Pulse: 75  Resp: 20  Weight: 117 lb 8 oz (53.3 kg)  Height: 5\' 7"  (1.702 m)    Body mass index is 18.4 kg/m.    General: The patient is well-developed and well-nourished and in no acute distress.      Skin: Extremities are without rash or significant edema.   Neurologic Exam  Mental status: The patient is alert and oriented x 3 at the time of the examination.  She has mildly reduced focus and attention.  She has mild reduced memory and attention, no worse than earlier this year.    Speech is normal.  Cranial nerves: Extraocular movements are full.    Facial strength  and sensation is normal.  Trapezius strength is normal   No obvious hearing deficits are noted.  Motor:  Muscle bulk is normal.   Muscle tone is increased in the legs, right greater than left.  Strength is 5/5.  Sensory: Sensory testing is intact to soft touch and vibration sensation in all 4 extremities.  Coordination: Finger-nose-finger is performed fairly well bilaterally. Heel-to-shin is reduced on the right.    Gait and station: Station is normal.   The stride is reduced and the stance is wide. She is able to walk without a cane. She cannot do a tandem walk.. Romberg is negative.   Reflexes: Deep tendon reflexes are increased in both legs.        DIAGNOSTIC DATA (LABS, IMAGING, TESTING) - I reviewed patient records, labs, notes, testing and  imaging myself where available.     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   She will continue Gilenya.  Check blood work today she wants to hold off on a f/u MRI. 2.   Continue buspirone, citalopram and Nuedexta, increase lamotrigine to 150 mg twice daily.    To help with insomnia, resume the low-dose Ativan at night 3.   Renew Methylphenidate for attention/focus and fatigue 4.   Due to a combination of physical and cognitive disabilities (pseudobulbar affect) and fatigue, she is permanently disabled.      5.   Return in 6 months or sooner if there are new or worsening neurologic symptoms.   Keita Demarco A. Felecia Shelling, MD, PhD 7/71/1657, 9:03 PM Certified in Neurology, Clinical Neurophysiology, Sleep Medicine, Pain Medicine and Neuroimaging  [facility] [address] [place], [place] [address] [phone]

## 2018-03-21 ENCOUNTER — Telehealth: Payer: Self-pay | Admitting: *Deleted

## 2018-03-21 LAB — CBC WITH DIFFERENTIAL/PLATELET
Basophils Absolute: 0 10*3/uL (ref 0.0–0.2)
Basos: 0 %
EOS (ABSOLUTE): 0.1 10*3/uL (ref 0.0–0.4)
EOS: 2 %
HEMATOCRIT: 37.4 % (ref 34.0–46.6)
Hemoglobin: 12.4 g/dL (ref 11.1–15.9)
IMMATURE GRANULOCYTES: 1 %
Immature Grans (Abs): 0 10*3/uL (ref 0.0–0.1)
Lymphocytes Absolute: 0.4 10*3/uL — ABNORMAL LOW (ref 0.7–3.1)
Lymphs: 8 %
MCH: 30.5 pg (ref 26.6–33.0)
MCHC: 33.2 g/dL (ref 31.5–35.7)
MCV: 92 fL (ref 79–97)
MONOS ABS: 0.8 10*3/uL (ref 0.1–0.9)
Monocytes: 15 %
Neutrophils Absolute: 3.8 10*3/uL (ref 1.4–7.0)
Neutrophils: 74 %
PLATELETS: 323 10*3/uL (ref 150–450)
RBC: 4.07 x10E6/uL (ref 3.77–5.28)
RDW: 12.9 % (ref 12.3–15.4)
WBC: 5.1 10*3/uL (ref 3.4–10.8)

## 2018-03-21 LAB — HEPATIC FUNCTION PANEL
ALK PHOS: 57 IU/L (ref 39–117)
ALT: 23 IU/L (ref 0–32)
AST: 24 IU/L (ref 0–40)
Albumin: 4.3 g/dL (ref 3.5–5.5)
BILIRUBIN, DIRECT: 0.07 mg/dL (ref 0.00–0.40)
Total Protein: 6.7 g/dL (ref 6.0–8.5)

## 2018-03-21 NOTE — Telephone Encounter (Signed)
Left patient a detailed message, with results, on voicemail (ok per DPR).  Provided our number to call back with any questions.  

## 2018-03-21 NOTE — Telephone Encounter (Signed)
-----   Message from Britt Bottom, MD sent at 03/21/2018 10:37 AM EDT ----- Please let the patient know that the lab work is fine   Low lymphocytes are common with Gilenya

## 2018-04-05 ENCOUNTER — Other Ambulatory Visit: Payer: Self-pay | Admitting: Neurology

## 2018-04-24 ENCOUNTER — Telehealth: Payer: Self-pay | Admitting: Neurology

## 2018-04-24 NOTE — Telephone Encounter (Signed)
Pt has called asking for a letter explaining in detail why she is unable to get a job and explaining her cognitive challenges.

## 2018-04-25 ENCOUNTER — Encounter: Payer: Self-pay | Admitting: Neurology

## 2018-04-25 NOTE — Telephone Encounter (Signed)
Letter printed, I will sign

## 2018-04-25 NOTE — Telephone Encounter (Signed)
Spoke to patient - she requested letter be mailed to her home address.

## 2018-05-08 ENCOUNTER — Other Ambulatory Visit: Payer: Self-pay | Admitting: Neurology

## 2018-05-08 MED ORDER — RITALIN 10 MG PO TABS: ORAL_TABLET | ORAL | 0 refills | Status: DC

## 2018-05-08 NOTE — Telephone Encounter (Signed)
Patient stated she needs a refill on Ritalin.

## 2018-05-12 ENCOUNTER — Telehealth: Payer: Self-pay | Admitting: *Deleted

## 2018-05-12 NOTE — Telephone Encounter (Signed)
PA for Ritalin 10mg  #90/30 (20mg  qam and 10mg  qnoon) completed via Cover My Meds, Key# [id number]. Dx: ADD (F90.9).  Tried and failed meds: Citalopram, Buspar, Methylphenidate 20mg [place]

## 2018-05-14 NOTE — Telephone Encounter (Signed)
Fax received from [place]. Ritalin PA approved and authorization will remain in effect for as long as the policy remains active.  PT ID: [id number]. Request ID: [id number]

## 2018-07-15 ENCOUNTER — Other Ambulatory Visit: Payer: Self-pay | Admitting: *Deleted

## 2018-07-15 MED ORDER — FINGOLIMOD HCL 0.5 MG PO CAPS: 1.0000 | ORAL_CAPSULE | Freq: Every day | ORAL | 3 refills | Status: DC

## 2018-07-17 ENCOUNTER — Telehealth: Payer: Self-pay | Admitting: Neurology

## 2018-07-17 NOTE — Telephone Encounter (Addendum)
I called and spoke w/ Verdene Lennert ([facility]). Advised we sent them rx Gilenya on 07/15/18 d/t receiving letter that [facility] will become [employer]'s preferred specialty pharmacy. When she tried to process rx, she was advised I would need to speak with [employer]. She transferred me to [place] spoke with [place] with [facility]. Test claim on 07/17/18.  Still needed to be completed on clearance. Rx was transferred from [facility] to [place]. They are currently working on getting information transitioned over. They contacted pt. Pt missed dose on Gilenya. She spoke with pharmacist.   Asked for this request to be expedited. Schedule on or after 07/28/18- trying to resolve this issue.  Current status: hoping to complete everything by tomorrow.  I verified pt information with them.  While on the phone, they are ready to schedule. She also called the hub to get the FDO date. They did not have exact date but stated she was "historical" Advised she has been on Gilenya since before 2016. She also called the pt to set up the order while I was on the phone.

## 2018-07-17 NOTE — Telephone Encounter (Signed)
Pt requesting a call to discuss getting a sample dose for Gilenya

## 2018-07-17 NOTE — Telephone Encounter (Signed)
I called pt back. She has only missed one day of Gilenya. She spoke with specialty pharmacy and they are shipping her the med to get there by tomorrow morning. She is ok to miss one day. We do not have any samples. She verbalized understanding.

## 2018-07-17 NOTE — Telephone Encounter (Signed)
[employer] [facility] called to state the patient is out of Gilenya. They would like [place] to send a script to [facility] on [address] In [place]. They also need a refill sent to [facility] sp.

## 2018-07-17 NOTE — Telephone Encounter (Signed)
Pt requesting a call to discuss her transfer in [place]

## 2018-07-21 ENCOUNTER — Encounter: Payer: Self-pay | Admitting: *Deleted

## 2018-07-21 ENCOUNTER — Telehealth: Payer: Self-pay | Admitting: Neurology

## 2018-07-21 NOTE — Telephone Encounter (Signed)
Pt called back. I took call from phone staff. She would like letter addend ed to include that she is disabled and unable to work because of [place]. Advised I will make this change and place letter up front. She will come by tomorrow to pick up.

## 2018-07-21 NOTE — Telephone Encounter (Signed)
Reviewed with Dr. Felecia Shelling, pt has been following with him since 2012 for Multiple Sclerosis, abnormal gait, pseudobulbar affect.

## 2018-07-21 NOTE — Telephone Encounter (Signed)
Pt requesting a call stating she is needing a letter stating how long Dr. Felecia Shelling has been treating her and what her condition is. Please advise

## 2018-07-21 NOTE — Telephone Encounter (Signed)
Pt has called asking if RN Terrence Dupont will right a brief summary of what pseudobulbar affect means.  Pt said she knows what it means but for who the letter is going to an explanation of pseudobulbar affect would be beneficial

## 2018-07-21 NOTE — Telephone Encounter (Signed)
Placed letter up front for pick up.

## 2018-07-21 NOTE — Telephone Encounter (Signed)
I called pt back. Advised I provided a print out from [email or web address] detailing what pseudobulbar affect is for her. She verbalized understanding and appreciation.

## 2018-07-21 NOTE — Telephone Encounter (Signed)
Waiting on MD signature on letter

## 2018-07-21 NOTE — Telephone Encounter (Signed)
Called, LVM for pt letting her know letter ready for pick up. Placed up front for her. Asked her to call back if she has further questions/concerns.

## 2018-07-22 ENCOUNTER — Encounter: Payer: Self-pay | Admitting: *Deleted

## 2018-07-23 ENCOUNTER — Other Ambulatory Visit: Payer: Self-pay | Admitting: *Deleted

## 2018-07-23 ENCOUNTER — Telehealth: Payer: Self-pay | Admitting: Neurology

## 2018-07-23 MED ORDER — SERTRALINE HCL 50 MG PO TABS: 50.0000 mg | ORAL_TABLET | Freq: Every day | ORAL | 1 refills | Status: DC

## 2018-07-23 NOTE — Addendum Note (Signed)
Addended by: France Ravens I on: 07/23/2018 04:28 PM   Modules accepted: Orders

## 2018-07-23 NOTE — Telephone Encounter (Signed)
I called [facility] back to let them know patient has been on Gilenya since before 2016. We do not have FDO date. Hub was contacted last week on 07/17/18 and they have medication listed as historical. They do not have exact date either. They updated patients records.   They also contacted the pharmacy to see what clarification they needed on the prescription.

## 2018-07-23 NOTE — Telephone Encounter (Signed)
Lydia @ [facility] has called re: major drug interation between Fingolimod HCl (GILENYA) 0.5 MG CAPS and NUEDEXTA 20-10 MG CAPS when calling back please use reference# Patient ID [id number]

## 2018-07-23 NOTE — Telephone Encounter (Signed)
Spoke with Opal Sidles. Due to potential drug interactions with Citalopram/Nuedexta/Gilenya, Dr. Felecia Shelling would like her to d/c Citalopram and begin Zoloft 50mg  daily. She verbalized understanding of same, sts. will dispose of Citalopram rx. Zoloft rx. escribed to [facility] per her request/fim

## 2018-07-23 NOTE — Telephone Encounter (Signed)
Mimi with [facility] called regarding the patients Gilenyia medication. They are requesting the nurse call back because there is clarification needed to script and also they need the first fill observation date clarified. Please call and advise.

## 2018-07-23 NOTE — Telephone Encounter (Signed)
I spoke with Dawn Keith, pharmacist. Verified pt has been on medication Gilenya for awhile. No reason they cannot fill. They were getting a flag for DUR but had nothing specific show up. No new meds started. He will go ahead and get medication ready for pt. Nothing further needed.

## 2018-07-23 NOTE — Telephone Encounter (Signed)
Pt has been on both medications for awhile and stable on them. I will call back to let [facility] know.

## 2018-07-23 NOTE — Telephone Encounter (Signed)
I called and spoke with Lesa U, pharmacist with [facility]. Relayed below information. She verbalized understanding. Nothing further needed.

## 2018-08-12 ENCOUNTER — Telehealth: Payer: Self-pay | Admitting: *Deleted

## 2018-08-12 DIAGNOSIS — Z0289 Encounter for other administrative examinations: Secondary | ICD-10-CM

## 2018-08-12 NOTE — Telephone Encounter (Signed)
Gave completed/signed FMLA back to medical records to process for pt. It is for her husband.

## 2018-08-18 ENCOUNTER — Other Ambulatory Visit: Payer: Self-pay | Admitting: Neurology

## 2018-09-29 ENCOUNTER — Ambulatory Visit: Payer: Managed Care, Other (non HMO) | Admitting: Neurology

## 2018-09-29 ENCOUNTER — Encounter: Payer: Self-pay | Admitting: Neurology

## 2018-09-29 VITALS — BP 114/74 | HR 75 | Ht 67.0 in | Wt 123.5 lb

## 2018-09-29 DIAGNOSIS — G3281 Cerebellar ataxia in diseases classified elsewhere: Secondary | ICD-10-CM | POA: Diagnosis not present

## 2018-09-29 DIAGNOSIS — F482 Pseudobulbar affect: Secondary | ICD-10-CM | POA: Diagnosis not present

## 2018-09-29 DIAGNOSIS — N3942 Incontinence without sensory awareness: Secondary | ICD-10-CM

## 2018-09-29 DIAGNOSIS — F09 Unspecified mental disorder due to known physiological condition: Secondary | ICD-10-CM

## 2018-09-29 DIAGNOSIS — G35 Multiple sclerosis: Secondary | ICD-10-CM | POA: Diagnosis not present

## 2018-09-29 DIAGNOSIS — R269 Unspecified abnormalities of gait and mobility: Secondary | ICD-10-CM

## 2018-09-29 DIAGNOSIS — Z79899 Other long term (current) drug therapy: Secondary | ICD-10-CM

## 2018-09-29 DIAGNOSIS — G4700 Insomnia, unspecified: Secondary | ICD-10-CM

## 2018-09-29 MED ORDER — LORAZEPAM 0.5 MG PO TABS: 0.5000 mg | ORAL_TABLET | Freq: Every day | ORAL | 5 refills | Status: DC

## 2018-09-29 MED ORDER — DALFAMPRIDINE ER 10 MG PO TB12: ORAL_TABLET | ORAL | 11 refills | Status: DC

## 2018-09-29 NOTE — Progress Notes (Addendum)
[facility]  PATIENT: Dawn Keith DOB: [birth year]  REFERRING DOCTOR OR PCP:  None  SOURCE: patient  _________________________________   HISTORICAL  CHIEF COMPLAINT:  Chief Complaint  Patient presents with  . Follow-up    RM 13 with Rush Landmark (husband) Last seen 03/20/2018.   . Multiple Sclerosis    On Gilenya. Tolerating well.  . Fall    Has fallen about 5 times since last seen. Ambulates with cane. Hit head during one fall late summer. Had to get 3 staples in head.     HISTORY OF PRESENT ILLNESS:  Dawn Keith is a 58 y.o. woman with MS.    Update 09/29/2018: She is on Gilenya and tolerates it well.   No exacerbations.   She has had more falls.   She has had 5 falls in last 6 months, one in the shower where she hit her head (no LOC but bled so had staples placed).     We discussed using her walker more.   She notes that the right leg is mildly weaker than the left.  She has some spasticity in the right leg.  She denies any numbness or dysesthesias in the legs.  She is having difficulty with constipation.   We discussed some treatment options including probiotics, colace/senna and Miralax.   Try to avoid stimulant suppositories.   She has urinary frequency and urgency, helped by oxybutynin.   She continues to have depression despite her medications..  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  The Ritalin has helped the fatigue and focus/attention  Update 03/20/2018: She is doing well on Gilenya with no new exacerbations.    She denies any exacerbations.     She feels her gait is doing well.   She stumbles frequently and has had a few falls.    She had a small foot/ankle fracture and had to wear a boot x a month.    She gets dizzy if she turns rapidly.    She feels strength is about the same with some leg weakness.   If she falls she has trouble getting back up.   She denies numbness or painful tingling for the most part but sometimes has right hand numbness.     She has a lot of urinary frequency and urgency.   Oxybutynin has helped.     Vision is fine.  She has mild fatigue and has poor sleep.   She has difficulty with sleep onset and sleep maintenance.     She only takes the lorazepam prn.    She has depression and anxiety.    She often feels overwhelmed.    She has done better since the lamotrigine was added.    She is also on Buspar and citalopram.   Ritalin has helped her cognitive issues.      Update 10/22/2017: SHe feels her MS is stable.    She is on Gilenya and she tolerates it well.    Her gait is about the same.     She has  No recent falls.   Strength and sensation are unchanged and her right leg is still a little weaker than the left.  Her bladder function is doing.   Oxybutynin has helped.   Vision is ok.  She notes mild fatigue but no worsening.   Her right leg fatigues if she does a lot of activity.   She has anxiety at night and sleep onset is difficult.    Ritalin helps  her focus and attention and fatigue during the day.   She is better on 40-20 than 20-20 mg.   Mood is helped by her med's.   Lamotrigine, Buspar and citalopram have helped her anxiety and depression.  .  From 05/22/2017: MS:    She feels her MS has been mostly stable.     She is on Gilenya and tolerates it well.   She has had no definite exacerbations on it though has had mild worsening of gait over time.     Gait/Strength/Sensation:   She is noting some problems with her gait and fell in the dark recently.     She mostly uses her cane but uses a walker when she feels worse or tired.      Her right side is mildly weaker and mildly clumsier than the left and is mildly spastic.  She denies numbness and tingling in the limbs.      Bladder:  She has frequency and urgency occasioanl incontinence, helped by oxybutynin.   There is no hesitancy and she thinks she empties her bladder well. There have not been recent urinary tract infections.  Vision:   She reports no new visual  symptoms.     She is scheduled to see ophthalmology soon for her annual exam.  Fatigue/sleep:   She notes fatigue that worsens with heat and as the day goes on.    If she is more active, she needs to rest.    Ritalin has helped her fatigue but she has not taken any for the last month when she ran out.   She tolerates it well    She is sleeping better on current medications.  She still worries a lot about the next day and family issues.    If she wakes up after a few hours of sleep that she may not be on fall back asleep again.  Mood/Cognition:   She feels mood is better on lamotrigine and Nuedexta.   She is having marital issues and stress.   She also reports difficulty dealing with her husband's family.  Although she is stressed, she has fewer outbursts.    Celexa and Buspar also have helped.   She was seeing Psych but can't afford to continue due to copays.    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      MS history:  She was diagnosed with MS in 2012 after several years of worsening gait.      She started on Gilenya and has tolerated it well and has had no defiinite exacerbation but has worsened.   Her last MRI was 2015 at [facility].        REVIEW OF SYSTEMS: Constitutional: No fevers, chills, sweats, or change in appetite.  Notes fatigue Eyes: No visual changes, double vision, eye pain Ear, nose and throat: No hearing loss, ear pain, nasal congestion, sore throat Cardiovascular: No chest pain, palpitations Respiratory: No shortness of breath at rest or with exertion.   No wheezes.   Pneumonia in 2017 GastrointestinaI: No nausea, vomiting, diarrhea, abdominal pain, fecal incontinence Genitourinary: as above Musculoskeletal: No neck pain, back pain Integumentary: No rash, pruritus, skin lesions Neurological: as above Psychiatric: as above Endocrine: No palpitations, diaphoresis, change in appetite, change in  weigh or increased thirst Hematologic/Lymphatic: No anemia, purpura, petechiae. Allergic/Immunologic: No itchy/runny eyes, nasal congestion, recent allergic reactions, rashes  ALLERGIES: Allergies  Allergen Reactions  .  Donepezil     "makes me mean"  . Latex     HOME MEDICATIONS:  Current Outpatient Medications:  .  busPIRone (BUSPAR) 15 MG tablet, TAKE 1 TABLET BY MOUTH TWICE DAILY, Disp: 180 tablet, Rfl: 1 .  Cholecalciferol 5000 UNITS TABS, Take 5,000 Units by mouth daily. , Disp: , Rfl:  .  Cyanocobalamin (VITAMIN B-12) 2500 MCG TABS, Take 2,500 mcg by mouth daily., Disp: 30 tablet, Rfl: 5 .  DOCOSAHEXAENOIC ACID PO, Take by mouth., Disp: , Rfl:  .  Docusate Calcium (STOOL SOFTENER PO), Take 1 tablet by mouth at bedtime., Disp: , Rfl:  .  Fingolimod HCl (GILENYA) 0.5 MG CAPS, Take 1 capsule (0.5 mg total) by mouth daily., Disp: 90 capsule, Rfl: 3 .  lamoTRIgine (LAMICTAL) 150 MG tablet, Take 1 tablet twice daily, Disp: 180 tablet, Rfl: 3 .  levothyroxine (SYNTHROID, LEVOTHROID) 75 MCG tablet, , Disp: , Rfl:  .  LORazepam (ATIVAN) 0.5 MG tablet, Take 1 tablet (0.5 mg total) by mouth at bedtime., Disp: 30 tablet, Rfl: 5 .  lovastatin (MEVACOR) 10 MG tablet, Take 10 mg by mouth at bedtime., Disp: , Rfl:  .  Naproxen Sodium 220 MG CAPS, Take by mouth., Disp: , Rfl:  .  NUEDEXTA 20-10 MG CAPS, TAKE 1 CAPSULE BY MOUTH TWICE DAILY, Disp: 60 capsule, Rfl: 11 .  oxybutynin (DITROPAN) 5 MG tablet, TAKE 1 TABLET BY MOUTH TWICE DAILY, Disp: 180 tablet, Rfl: 2 .  RITALIN 10 MG tablet, Methylphenidate 10 mg.   Take 2 tablets in the morning and 1 tablet at noon., Disp: 90 tablet, Rfl: 0 .  sertraline (ZOLOFT) 50 MG tablet, Take 1 tablet (50 mg total) by mouth daily., Disp: 180 tablet, Rfl: 1 .  dalfampridine 10 MG TB12, One po q12, Disp: 60 tablet, Rfl: 11  PAST MEDICAL HISTORY: Past Medical History:  Diagnosis Date  . Depressed   . Hyperlipemia   . Movement disorder   . MS (multiple  sclerosis) ([place])   . Vision abnormalities     PAST SURGICAL HISTORY: Past Surgical History:  Procedure Laterality Date  . ABDOMINAL HYSTERECTOMY      FAMILY HISTORY: History reviewed. No pertinent family history.  SOCIAL HISTORY:  Social History   Socioeconomic History  . Marital status: Married    Spouse name: Not on file  . Number of children: Not on file  . Years of education: Not on file  . Highest education level: Not on file  Occupational History  . Not on file  Social Needs  . Financial resource strain: Not on file  . Food insecurity:    Worry: Not on file    Inability: Not on file  . Transportation needs:    Medical: Not on file    Non-medical: Not on file  Tobacco Use  . Smoking status: Never Smoker  . Smokeless tobacco: Never Used  Substance and Sexual Activity  . Alcohol use: No  . Drug use: No  . Sexual activity: Never    Birth control/protection: Surgical  Lifestyle  . Physical activity:    Days per week: Not on file    Minutes per session: Not on file  . Stress: Not on file  Relationships  . Social connections:    Talks on phone: Not on file    Gets together: Not on file    Attends religious service: Not on file    Active member of club or organization: Not on file    Attends meetings  of clubs or organizations: Not on file    Relationship status: Not on file  . Intimate partner violence:    Fear of current or ex partner: Not on file    Emotionally abused: Not on file    Physically abused: Not on file    Forced sexual activity: Not on file  Other Topics Concern  . Not on file  Social History Narrative   Lives with husband, Rush Landmark     PHYSICAL EXAM  Vitals:   09/29/18 1054  BP: 114/74  Pulse: 75  Weight: 123 lb 8 oz (56 kg)  Height: 5\' 7"  (1.702 m)    Body mass index is 19.34 kg/m.    General: The patient is well-developed and well-nourished and in no acute distress.      Skin: Extremities are without rash or significant  edema.   Neurologic Exam  Mental status: The patient is alert and oriented x 3 at the time of the examination.  She has mildly reduced focus and attention.  She has mild reduced memory and attention, no worse than earlier this year.    Speech is normal.  Cranial nerves: Extraocular movements are full.    Facial strength and sensation is normal.  Trapezius strength is normal   No obvious hearing deficits are noted.  Motor:  Muscle bulk is normal.   Muscle tone is increased in the legs, right greater than left.  Strength is 5/5.  Sensory: Sensory testing is intact to soft touch and vibration sensation in all 4 extremities.  Coordination: Finger-nose-finger is performed fairly well bilaterally. However has reduced heel to shin on the righ.    Gait and station: Station is normal.   The stride is reduced and the stance is wide. She is able to walk without a cane. She cannot do a tandem walk.. Romberg is negative.   Reflexes: Deep tendon reflexes are increased in both legs.    25 foot timed walk = 11.4 sec (average of two trials)    DIAGNOSTIC DATA (LABS, IMAGING, TESTING) - I reviewed patient records, labs, notes, testing and imaging myself where available.     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   She will continue Gilenya.  We will check some blood work.  Also need to check MRI.  If she has progressed with subclinical activity on the MRI, we will need to consider a switch from  Gilenya to 1 of the infusible medications.  2.   Continue other medications including buspirone, citalopram and Nuedexta and lamotrigine.    Okay for low-dose Ativan at night. 3.   Renew Methylphenidate for attention/focus and fatigue 4.   Due to a combination of physical and cognitive disabilities (pseudobulbar affect) and fatigue, she is permanently disabled.   5.   Return in 6 months or sooner if there are new or worsening neurologic symptoms.   Brendt Dible A. Felecia Shelling, MD, PhD 11/30/1015, 51:02 AM Certified in Neurology, Clinical Neurophysiology, Sleep Medicine, Pain Medicine and Neuroimaging  [facility] [address], [place], [place]  [address] [phone][p

## 2018-09-30 ENCOUNTER — Telehealth: Payer: Self-pay | Admitting: *Deleted

## 2018-09-30 LAB — CBC WITH DIFFERENTIAL/PLATELET
Basophils Absolute: 0 10*3/uL (ref 0.0–0.2)
Basos: 1 %
EOS (ABSOLUTE): 0 10*3/uL (ref 0.0–0.4)
Eos: 1 %
Hematocrit: 39 % (ref 34.0–46.6)
Hemoglobin: 12.9 g/dL (ref 11.1–15.9)
Immature Grans (Abs): 0 10*3/uL (ref 0.0–0.1)
Immature Granulocytes: 1 %
Lymphocytes Absolute: 0.4 10*3/uL — ABNORMAL LOW (ref 0.7–3.1)
Lymphs: 11 %
MCH: 30.9 pg (ref 26.6–33.0)
MCHC: 33.1 g/dL (ref 31.5–35.7)
MCV: 94 fL (ref 79–97)
Monocytes Absolute: 0.7 10*3/uL (ref 0.1–0.9)
Monocytes: 17 %
Neutrophils Absolute: 2.8 10*3/uL (ref 1.4–7.0)
Neutrophils: 69 %
Platelets: 322 10*3/uL (ref 150–450)
RBC: 4.17 x10E6/uL (ref 3.77–5.28)
RDW: 12.8 % (ref 11.7–15.4)
WBC: 4 10*3/uL (ref 3.4–10.8)

## 2018-09-30 LAB — COMPREHENSIVE METABOLIC PANEL
ALT: 13 IU/L (ref 0–32)
AST: 16 IU/L (ref 0–40)
Albumin/Globulin Ratio: 1.9 (ref 1.2–2.2)
Albumin: 4.6 g/dL (ref 3.5–5.5)
Alkaline Phosphatase: 58 IU/L (ref 39–117)
BILIRUBIN TOTAL: 0.3 mg/dL (ref 0.0–1.2)
BUN/Creatinine Ratio: 18 (ref 9–23)
BUN: 17 mg/dL (ref 6–24)
CO2: 27 mmol/L (ref 20–29)
Calcium: 9.8 mg/dL (ref 8.7–10.2)
Chloride: 104 mmol/L (ref 96–106)
Creatinine, Ser: 0.93 mg/dL (ref 0.57–1.00)
GFR calc Af Amer: 78 mL/min/{1.73_m2} (ref >59)
GFR calc non Af Amer: 67 mL/min/{1.73_m2} (ref >59)
Globulin, Total: 2.4 g/dL (ref 1.5–4.5)
Glucose: 88 mg/dL (ref 65–99)
POTASSIUM: 5.3 mmol/L — AB (ref 3.5–5.2)
Sodium: 144 mmol/L (ref 134–144)
TOTAL PROTEIN: 7 g/dL (ref 6.0–8.5)

## 2018-09-30 NOTE — Telephone Encounter (Signed)
-----   Message from Britt Bottom, MD sent at 09/30/2018  9:12 AM EST ----- Please let her know that the labs look okay.  Lymphocytes are low, as expected with Gilenya.

## 2018-09-30 NOTE — Telephone Encounter (Signed)
Pt. aware of lab results/fim

## 2018-10-01 ENCOUNTER — Telehealth: Payer: Self-pay | Admitting: Neurology

## 2018-10-01 MED ORDER — LORAZEPAM 0.5 MG PO TABS: ORAL_TABLET | ORAL | 0 refills | Status: DC

## 2018-10-01 NOTE — Telephone Encounter (Signed)
Ok I will send in a prescription for a couple ativans.

## 2018-10-01 NOTE — Addendum Note (Signed)
Addended by: Britt Bottom on: 10/01/2018 03:39 PM   Modules accepted: Orders

## 2018-10-01 NOTE — Addendum Note (Signed)
Addended by: Hope Pigeon on: 10/01/2018 03:06 PM   Modules accepted: Orders

## 2018-10-01 NOTE — Telephone Encounter (Signed)
Patient returned my call she is scheduled for 10/07/18 for [facility].   She informed me she is claustrophobic and would need something to help her.

## 2018-10-01 NOTE — Telephone Encounter (Signed)
lvm for pt to call to call back about scheduling Dawn Keith Auth: [id number] (exp. 10/01/18 to 12/30/18)

## 2018-10-06 ENCOUNTER — Telehealth: Payer: Self-pay | Admitting: Neurology

## 2018-10-06 ENCOUNTER — Other Ambulatory Visit: Payer: Self-pay | Admitting: Neurology

## 2018-10-06 MED ORDER — OXYBUTYNIN CHLORIDE 5 MG PO TABS: 5.0000 mg | ORAL_TABLET | Freq: Two times a day (BID) | ORAL | 3 refills | Status: DC

## 2018-10-06 NOTE — Telephone Encounter (Signed)
I called [facility] and spoke with Dorian. Unable to fill separate rx for #3 tablets since pt just received ativan 0.5mg  on 09/29/18 #30. They advised she can use current rx and fill #3 tablets when she runs out of that prescription.   I called pt and she states her daughter shelby manages her medications. She is not on her DPR form. Pt wrote down that she can take 1-2 tablets 30 min prior to MRI tomorrow. She will have her daughter find prescription bottle from 09/29/18 when she got it filled. She will call back if she needs anything further.

## 2018-10-06 NOTE — Telephone Encounter (Signed)
E-scribed refills as requested.  

## 2018-10-06 NOTE — Telephone Encounter (Signed)
Pt is needing a refill for her oxybutynin (DITROPAN) 5 MG tablet called into the [facility] on [address]

## 2018-10-06 NOTE — Telephone Encounter (Signed)
I spoke to the patient to remind her of her appointment for tomorrow. She informed me Suzie Portela will not fill the ativan until January 28 and she needs something for tomorrow.

## 2018-10-07 ENCOUNTER — Ambulatory Visit (INDEPENDENT_AMBULATORY_CARE_PROVIDER_SITE_OTHER): Payer: Managed Care, Other (non HMO)

## 2018-10-07 ENCOUNTER — Other Ambulatory Visit: Payer: Self-pay | Admitting: Neurology

## 2018-10-07 DIAGNOSIS — G35 Multiple sclerosis: Secondary | ICD-10-CM

## 2018-10-07 DIAGNOSIS — G3281 Cerebellar ataxia in diseases classified elsewhere: Secondary | ICD-10-CM | POA: Diagnosis not present

## 2018-10-07 MED ORDER — GADOBENATE DIMEGLUMINE 529 MG/ML IV SOLN: 11.0000 mL | Freq: Once | INTRAVENOUS | Status: DC | PRN

## 2018-10-09 ENCOUNTER — Other Ambulatory Visit: Payer: Self-pay | Admitting: *Deleted

## 2018-10-09 MED ORDER — DALFAMPRIDINE ER 10 MG PO TB12: ORAL_TABLET | ORAL | 11 refills | Status: DC

## 2018-10-10 ENCOUNTER — Telehealth: Payer: Self-pay | Admitting: *Deleted

## 2018-10-10 NOTE — Telephone Encounter (Signed)
-----   Message from Britt Bottom, MD sent at 10/10/2018  9:26 AM EST ----- Please let her know that the MRIs of the brain and cervical spine showed many MS lesions but there was no significant change compared to the 2012 MRIs.  The atrophy was slightly more.  She does have some arthritis and disc changes in the neck but nothing bad enough to see a surgeon about.

## 2018-10-10 NOTE — Telephone Encounter (Signed)
PA dalfampridine submitted to covermymeds.Key: [id number]. Waiting on determination.  "Your information has been submitted and will be reviewed by [place]. You may close this dialog, return to your dashboard, and perform other tasks. An electronic determination will be received in CoverMyMeds within 72-120 hours. If Dawn Keith has not responded in 120 hours, contact [employer] at [phone]."

## 2018-10-10 NOTE — Telephone Encounter (Signed)
Called and spoke with patient. Relayed results per Dr. Felecia Shelling note. She verbalized understanding and appreciation for call.

## 2018-10-13 NOTE — Telephone Encounter (Signed)
Fax received from [place]. Dalfampridine ER 10mg  approved from 10/12/18 thru 10/12/19. Pt. ID: [id number].  Request ID: [id number]

## 2018-12-08 ENCOUNTER — Telehealth: Payer: Self-pay | Admitting: Neurology

## 2018-12-08 MED ORDER — SERTRALINE HCL 50 MG PO TABS: 50.0000 mg | ORAL_TABLET | Freq: Every day | ORAL | 1 refills | Status: DC

## 2018-12-08 NOTE — Telephone Encounter (Signed)
E-scribed refill 

## 2018-12-08 NOTE — Telephone Encounter (Signed)
Pt called in and wanted a refill of sertraline (ZOLOFT) 50 MG tablet sent to  [facility] ([address]), [place] [phone] (Phone) [phone] (Fax)

## 2018-12-10 ENCOUNTER — Other Ambulatory Visit: Payer: Self-pay | Admitting: Neurology

## 2019-01-06 ENCOUNTER — Telehealth: Payer: Self-pay | Admitting: Neurology

## 2019-01-06 NOTE — Telephone Encounter (Signed)
Pt is needing a refill on her NUEDEXTA 20-10 MG capsule sent to [facility] on [address]

## 2019-01-06 NOTE — Telephone Encounter (Signed)
I called and spoke with pt. Advised we sent refills on 12/10/18 #60, 11 refills. She will call pharmacy to follow up.

## 2019-03-26 ENCOUNTER — Other Ambulatory Visit: Payer: Self-pay | Admitting: Neurology

## 2019-04-06 ENCOUNTER — Telehealth: Payer: Self-pay | Admitting: Neurology

## 2019-04-06 MED ORDER — BUSPIRONE HCL 15 MG PO TABS: 15.0000 mg | ORAL_TABLET | Freq: Two times a day (BID) | ORAL | 1 refills | Status: DC

## 2019-04-06 NOTE — Telephone Encounter (Signed)
Pt is needing a refill on her busPIRone (BUSPAR) 15 MG tablet sent to the [facility] on [address] Abbott Pao states [facility] is not wanting to fill it for her. Please advise.

## 2019-04-06 NOTE — Telephone Encounter (Signed)
Called pt. She last saw Dr. Felecia Shelling 09/2018. Due for f/u. Scheduled f/u for 04/08/19 at 4pm, check in 330pm. Asked that she wear mask to appt and temp will be checked. She is allowed one other person to come with her.   Sent refill to pharmacy for buspar as requested.

## 2019-04-08 ENCOUNTER — Other Ambulatory Visit: Payer: Self-pay

## 2019-04-08 ENCOUNTER — Ambulatory Visit: Payer: Managed Care, Other (non HMO) | Admitting: Neurology

## 2019-04-08 ENCOUNTER — Encounter: Payer: Self-pay | Admitting: Neurology

## 2019-04-08 VITALS — BP 108/62 | HR 67 | Temp 98.4°F | Ht 67.0 in | Wt 121.8 lb

## 2019-04-08 DIAGNOSIS — F418 Other specified anxiety disorders: Secondary | ICD-10-CM

## 2019-04-08 DIAGNOSIS — F09 Unspecified mental disorder due to known physiological condition: Secondary | ICD-10-CM

## 2019-04-08 DIAGNOSIS — G35 Multiple sclerosis: Secondary | ICD-10-CM

## 2019-04-08 DIAGNOSIS — R269 Unspecified abnormalities of gait and mobility: Secondary | ICD-10-CM

## 2019-04-08 MED ORDER — LAMOTRIGINE 150 MG PO TABS: ORAL_TABLET | ORAL | 3 refills | Status: DC

## 2019-04-08 MED ORDER — SERTRALINE HCL 100 MG PO TABS: 100.0000 mg | ORAL_TABLET | Freq: Every day | ORAL | 3 refills | Status: DC

## 2019-04-08 MED ORDER — SERTRALINE HCL 100 MG PO TABS: 100.0000 mg | ORAL_TABLET | Freq: Every day | ORAL | 11 refills | Status: DC
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
## 2019-04-09 ENCOUNTER — Telehealth: Payer: Self-pay | Admitting: *Deleted

## 2019-04-09 LAB — CBC WITH DIFFERENTIAL/PLATELET
Basophils Absolute: 0 10*3/uL (ref 0.0–0.2)
Basos: 0 %
EOS (ABSOLUTE): 0 10*3/uL (ref 0.0–0.4)
Eos: 1 %
Hematocrit: 35.1 % (ref 34.0–46.6)
Hemoglobin: 11.8 g/dL (ref 11.1–15.9)
Immature Grans (Abs): 0 10*3/uL (ref 0.0–0.1)
Immature Granulocytes: 1 %
Lymphocytes Absolute: 0.5 10*3/uL — ABNORMAL LOW (ref 0.7–3.1)
Lymphs: 11 %
MCH: 30.9 pg (ref 26.6–33.0)
MCHC: 33.6 g/dL (ref 31.5–35.7)
MCV: 92 fL (ref 79–97)
Monocytes Absolute: 0.6 10*3/uL (ref 0.1–0.9)
Monocytes: 16 %
Neutrophils Absolute: 3 10*3/uL (ref 1.4–7.0)
Neutrophils: 71 %
Platelets: 306 10*3/uL (ref 150–450)
RBC: 3.82 x10E6/uL (ref 3.77–5.28)
RDW: 12.9 % (ref 11.7–15.4)
WBC: 4.1 10*3/uL (ref 3.4–10.8)

## 2019-04-09 NOTE — Telephone Encounter (Signed)
-----   Message from Britt Bottom, MD sent at 04/09/2019  3:22 PM EDT ----- Please let the patient know that the lab work is fine.

## 2019-04-09 NOTE — Telephone Encounter (Signed)
Called and spoke with pt about results per Dr. Sater note. Pt verbalized understanding. 

## 2019-05-31 ENCOUNTER — Other Ambulatory Visit: Payer: Self-pay | Admitting: Neurology

## 2019-07-01 ENCOUNTER — Telehealth: Payer: Self-pay | Admitting: *Deleted

## 2019-07-01 NOTE — Telephone Encounter (Signed)
Received request on CMM to do PA for Gilenya. I submitted but received the following response from [employer]: "available without authorization" Key: [id number] - Rx #: [id number].

## 2019-08-10 ENCOUNTER — Telehealth: Payer: Self-pay | Admitting: Neurology

## 2019-08-10 NOTE — Telephone Encounter (Signed)
Patient called in regards to getting a letter for her divorce stating that she is 100 % disabled due to her cognitive disability stating that she is dependent on her medications and her physical state. Please follow up.

## 2019-08-11 NOTE — Telephone Encounter (Signed)
Letter formulated and awaiting MD signature.

## 2019-08-11 NOTE — Telephone Encounter (Signed)
I contacted the pt and advised letter is ready. Pt requested I mail the letter and I have placed in out going mail at [facility].

## 2019-09-09 ENCOUNTER — Telehealth: Payer: Self-pay | Admitting: *Deleted

## 2019-09-09 NOTE — Telephone Encounter (Signed)
Received fax notification from [place] that [place] approved 06/02/2019-06/01/2020. Pt ID [id number]. Request ID [id number].

## 2019-09-09 NOTE — Telephone Encounter (Signed)
Received PA request from [place] via fax. Completed PA and faxed back to [employer] at [phone]. Received fax confirmation. Waiting on determination.

## 2019-10-05 ENCOUNTER — Other Ambulatory Visit: Payer: Self-pay | Admitting: Neurology

## 2019-10-13 ENCOUNTER — Ambulatory Visit: Payer: Managed Care, Other (non HMO) | Admitting: Neurology

## 2019-11-02 ENCOUNTER — Telehealth: Payer: Self-pay | Admitting: Neurology

## 2019-11-02 NOTE — Telephone Encounter (Signed)
We already escribed rx to [facility] 10/05/19 #180 with 1 refill.

## 2019-11-02 NOTE — Telephone Encounter (Signed)
Pt  Is needing a refill on her oxybutynin (DITROPAN) 5 MG tablet sent in to the [facility] on [address]

## 2019-11-14 ENCOUNTER — Other Ambulatory Visit: Payer: Self-pay | Admitting: Neurology

## 2019-11-24 ENCOUNTER — Ambulatory Visit: Payer: Managed Care, Other (non HMO) | Admitting: Neurology

## 2019-12-13 ENCOUNTER — Other Ambulatory Visit: Payer: Self-pay | Admitting: Neurology

## 2019-12-17 ENCOUNTER — Telehealth: Payer: Self-pay | Admitting: Neurology

## 2019-12-17 NOTE — Telephone Encounter (Signed)
Pt would like to know if Dr Felecia Shelling feels she should get the Covid-19 vaccine, please call

## 2019-12-17 NOTE — Telephone Encounter (Signed)
Called pt back. Informed her Dr. Felecia Shelling is ok with her getting any of the three covid-19 vaccines available. Reminded her about f/u on 12/28/19. She verbalized understanding.

## 2019-12-28 ENCOUNTER — Ambulatory Visit: Payer: Managed Care, Other (non HMO) | Admitting: Neurology

## 2019-12-28 ENCOUNTER — Other Ambulatory Visit: Payer: Self-pay

## 2019-12-28 ENCOUNTER — Encounter: Payer: Self-pay | Admitting: Neurology

## 2019-12-28 VITALS — BP 95/60 | HR 73 | Temp 97.8°F | Ht 67.0 in | Wt 125.5 lb

## 2019-12-28 DIAGNOSIS — R5383 Other fatigue: Secondary | ICD-10-CM

## 2019-12-28 DIAGNOSIS — E7800 Pure hypercholesterolemia, unspecified: Secondary | ICD-10-CM

## 2019-12-28 DIAGNOSIS — R269 Unspecified abnormalities of gait and mobility: Secondary | ICD-10-CM

## 2019-12-28 DIAGNOSIS — F418 Other specified anxiety disorders: Secondary | ICD-10-CM

## 2019-12-28 DIAGNOSIS — I950 Idiopathic hypotension: Secondary | ICD-10-CM | POA: Diagnosis not present

## 2019-12-28 DIAGNOSIS — G4700 Insomnia, unspecified: Secondary | ICD-10-CM

## 2019-12-28 DIAGNOSIS — Z79899 Other long term (current) drug therapy: Secondary | ICD-10-CM

## 2019-12-28 DIAGNOSIS — G35 Multiple sclerosis: Secondary | ICD-10-CM

## 2019-12-28 DIAGNOSIS — F482 Pseudobulbar affect: Secondary | ICD-10-CM

## 2019-12-28 MED ORDER — LAMOTRIGINE 150 MG PO TABS: ORAL_TABLET | ORAL | 3 refills | Status: DC

## 2019-12-28 MED ORDER — ARIPIPRAZOLE 5 MG PO TABS: 5.0000 mg | ORAL_TABLET | Freq: Every day | ORAL | 3 refills | Status: DC

## 2019-12-28 MED ORDER — SERTRALINE HCL 100 MG PO TABS: 100.0000 mg | ORAL_TABLET | Freq: Every day | ORAL | 3 refills | Status: DC

## 2019-12-28 MED ORDER — BUSPIRONE HCL 15 MG PO TABS: 15.0000 mg | ORAL_TABLET | Freq: Two times a day (BID) | ORAL | 3 refills | Status: DC

## 2019-12-28 MED ORDER — OXYBUTYNIN CHLORIDE 5 MG PO TABS: 5.0000 mg | ORAL_TABLET | Freq: Two times a day (BID) | ORAL | 3 refills | Status: DC
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
## 2019-12-29 ENCOUNTER — Telehealth: Payer: Self-pay | Admitting: *Deleted

## 2019-12-29 LAB — COMPREHENSIVE METABOLIC PANEL
ALT: 18 IU/L (ref 0–32)
AST: 28 IU/L (ref 0–40)
Albumin/Globulin Ratio: 2 (ref 1.2–2.2)
Albumin: 4.4 g/dL (ref 3.8–4.8)
Alkaline Phosphatase: 98 IU/L (ref 39–117)
BUN/Creatinine Ratio: 23 (ref 12–28)
BUN: 18 mg/dL (ref 8–27)
Bilirubin Total: <0.2 mg/dL (ref 0.0–1.2)
CO2: 27 mmol/L (ref 20–29)
Calcium: 9.4 mg/dL (ref 8.7–10.3)
Chloride: 103 mmol/L (ref 96–106)
Creatinine, Ser: 0.8 mg/dL (ref 0.57–1.00)
GFR calc Af Amer: 92 mL/min/{1.73_m2} (ref >59)
GFR calc non Af Amer: 80 mL/min/{1.73_m2} (ref >59)
Globulin, Total: 2.2 g/dL (ref 1.5–4.5)
Glucose: 82 mg/dL (ref 65–99)
Potassium: 4.9 mmol/L (ref 3.5–5.2)
Sodium: 143 mmol/L (ref 134–144)
Total Protein: 6.6 g/dL (ref 6.0–8.5)

## 2019-12-29 LAB — CBC WITH DIFFERENTIAL/PLATELET
Basophils Absolute: 0 10*3/uL (ref 0.0–0.2)
Basos: 0 %
EOS (ABSOLUTE): 0 10*3/uL (ref 0.0–0.4)
Eos: 1 %
Hematocrit: 35.5 % (ref 34.0–46.6)
Hemoglobin: 11.5 g/dL (ref 11.1–15.9)
Immature Grans (Abs): 0.1 10*3/uL (ref 0.0–0.1)
Immature Granulocytes: 1 %
Lymphocytes Absolute: 0.5 10*3/uL — ABNORMAL LOW (ref 0.7–3.1)
Lymphs: 11 %
MCH: 31 pg (ref 26.6–33.0)
MCHC: 32.4 g/dL (ref 31.5–35.7)
MCV: 96 fL (ref 79–97)
Monocytes Absolute: 0.8 10*3/uL (ref 0.1–0.9)
Monocytes: 17 %
Neutrophils Absolute: 3.3 10*3/uL (ref 1.4–7.0)
Neutrophils: 70 %
Platelets: 301 10*3/uL (ref 150–450)
RBC: 3.71 x10E6/uL — ABNORMAL LOW (ref 3.77–5.28)
RDW: 13.6 % (ref 11.7–15.4)
WBC: 4.7 10*3/uL (ref 3.4–10.8)

## 2019-12-29 LAB — THYROID PANEL WITH TSH
Free Thyroxine Index: 1.3 (ref 1.2–4.9)
T3 Uptake Ratio: 24 % (ref 24–39)
T4, Total: 5.3 ug/dL (ref 4.5–12.0)
TSH: 6.76 u[IU]/mL — ABNORMAL HIGH (ref 0.450–4.500)

## 2019-12-29 NOTE — Telephone Encounter (Signed)
Called pt, relayed results per Dr. Felecia Shelling note. States her PCP is Malvin Johns, MD. Their office number: [phone]. She will f/u on PCP her Thyroid.   I called, they are with [facility]. [phone]. I forwarded results to their office via epic.

## 2019-12-29 NOTE — Telephone Encounter (Signed)
-----   Message from Britt Bottom, MD sent at 12/29/2019  8:45 AM EDT ----- Please let her know the Thyroid test shows she might have mild hypothyroidism --- we can send info to her PCP if she wants

## 2020-01-02 ENCOUNTER — Ambulatory Visit: Payer: Self-pay | Attending: Internal Medicine

## 2020-01-02 DIAGNOSIS — Z23 Encounter for immunization: Secondary | ICD-10-CM

## 2020-01-02 NOTE — Progress Notes (Signed)
   Covid-19 Vaccination Clinic  Name:  Dawn Keith    MRN: [id number] DOB: 05/10/1961  01/02/2020  [name] was observed post Covid-19 immunization for 15 minutes without incident. She was provided with Vaccine Information Sheet and instruction to access the V-Safe system.   [name] was instructed to call 911 with any severe reactions post vaccine: Marland Kitchen Difficulty breathing  . Swelling of face and throat  . A fast heartbeat  . A bad rash all over body  . Dizziness and weakness   Immunizations Administered    Name Date Dose VIS Date Route   Pfizer COVID-19 Vaccine 01/02/2020 11:36 AM 0.3 mL 09/04/2019 Intramuscular   Manufacturer: [place]   Lot: [id number]   [place]: [id number]

## 2020-01-25 ENCOUNTER — Ambulatory Visit: Payer: Self-pay | Attending: Internal Medicine

## 2020-01-25 DIAGNOSIS — Z23 Encounter for immunization: Secondary | ICD-10-CM

## 2020-01-25 NOTE — Progress Notes (Signed)
   Covid-19 Vaccination Clinic  Name:  Dawn Keith    MRN: [id number] DOB: 02/25/1961  01/25/2020  [name] was observed post Covid-19 immunization for 15 minutes without incident. She was provided with Vaccine Information Sheet and instruction to access the V-Safe system.   [name] was instructed to call 911 with any severe reactions post vaccine: Marland Kitchen Difficulty breathing  . Swelling of face and throat  . A fast heartbeat  . A bad rash all over body  . Dizziness and weakness   Immunizations Administered    Name Date Dose VIS Date Route   Pfizer COVID-19 Vaccine 01/25/2020 10:42 AM 0.3 mL 11/18/2018 Intramuscular   Manufacturer: [place]   Lot: [id number]   National: [id number]

## 2020-05-12 ENCOUNTER — Other Ambulatory Visit: Payer: Self-pay | Admitting: Neurology

## 2020-07-11 ENCOUNTER — Encounter: Payer: Self-pay | Admitting: Neurology

## 2020-07-11 ENCOUNTER — Ambulatory Visit: Payer: Managed Care, Other (non HMO) | Admitting: Neurology

## 2020-07-11 VITALS — BP 121/70 | HR 62 | Ht 67.0 in | Wt 119.5 lb

## 2020-07-11 DIAGNOSIS — Z79899 Other long term (current) drug therapy: Secondary | ICD-10-CM

## 2020-07-11 DIAGNOSIS — G4700 Insomnia, unspecified: Secondary | ICD-10-CM | POA: Diagnosis not present

## 2020-07-11 DIAGNOSIS — G35 Multiple sclerosis: Secondary | ICD-10-CM

## 2020-07-11 DIAGNOSIS — F09 Unspecified mental disorder due to known physiological condition: Secondary | ICD-10-CM

## 2020-07-11 DIAGNOSIS — F418 Other specified anxiety disorders: Secondary | ICD-10-CM

## 2020-07-11 DIAGNOSIS — R269 Unspecified abnormalities of gait and mobility: Secondary | ICD-10-CM

## 2020-07-11 DIAGNOSIS — G35D Multiple sclerosis, unspecified: Secondary | ICD-10-CM

## 2020-07-11 MED ORDER — QUETIAPINE FUMARATE 25 MG PO TABS: 25.0000 mg | ORAL_TABLET | Freq: Every day | ORAL | 5 refills | Status: DC

## 2020-07-11 NOTE — Progress Notes (Signed)
[facility]  PATIENT: Dawn Keith DOB: September 05, 1961  REFERRING DOCTOR OR PCP:  Halford Chessman, PA-C SOURCE: patient  _________________________________   HISTORICAL  CHIEF COMPLAINT:  Chief Complaint  Patient presents with  . Follow-up    Rm 12. Last seen 12/28/2019.   . Multiple Sclerosis    On Gilenya.   . Gait Problem    Ambulates with cane. She is falling more. Has about 1 fall per week. Reports legs are more weak. Wondering if MRI imaging should be ordered.    HISTORY OF PRESENT ILLNESS:  Dawn Keith is a 59 y.o. woman with MS.   Update 07/11/2020: She is on Gilenya and tolerates it well.  The MRIs of the brain and cervical spine performed last year did not show any new lesions.  She has no exacerbations but has had progression.   She is feeling weaker and has had more falls.   Her right leg gives out.   She notes weakness and stiffness.  She often shakes while standing.   Around the house she furniture surfs.   Generally she feels worse during the afternoons and evenings with more falls.  She has a cane and walker but never uses the walker.    Denies numbness or dysesthesia. Bladder is doing well on oxybutynin.    She has sleep onset and maintenance insomnia.   She has the TV on and has trouble sleeping laying down due to vertigo so she sleeps in a chair.   She dozes off/on  She has anxiety and depression.  She also has had pseudobulbar affect.  She occasionally has delusions   She takes buspirone, sertraline and lamotrigine and Nuedexta.   She has decreased cognition.   She also reports fatigue.   Ritalin had helped some but she discontinued.    She has had the Covid vaccination April/May and will be getting the booster soon.     MS history:   She was diagnosed with MS in 2012 after several years of worsening gait.    She started on Gilenya and has tolerated it well and has had no defiinite exacerbation but has had progressive gait disturbance and  cognitive issues.    IMAGING: MRI of the cervical spine 10/09/2018 without contrast shows multiple T2 hyperintense foci within the spinal cord as detailed above.  Compared to the 2012 MRI, none of these appear to be new.  They are consistent with chronic demyelinating plaque associated with multiple sclerosis.  Also has multilevel degenerative changes as detailed above that does not lead to spinal stenosis or nerve root compression.  There has been mild progression of the degenerative changes at C4-C5 and C5-C6.  MRI of the brain 10/09/2018 without contrast shows multiple T2/flair hyperintense foci in the hemispheres, brainstem and spinal cord in a pattern and configuration consistent with chronic demyelinating plaque associated with multiple sclerosis.  None of the foci appears to be acute.     Moderate generalized cortical atrophy.      Compared to the MRI dated 08/20/2011, there are no definite new lesions.  The left middle cerebellar peduncle lesion is smaller on the current study.  The generalized cortical atrophy has slightly progressed.    REVIEW OF SYSTEMS: Constitutional: No fevers, chills, sweats, or change in appetite.  Notes fatigue Eyes: No visual changes, double vision, eye pain Ear, nose and throat: No hearing loss, ear pain, nasal congestion, sore throat Cardiovascular: No chest pain, palpitations Respiratory: No shortness of breath at rest  or with exertion.   No wheezes.   Pneumonia in 2017 GastrointestinaI: No nausea, vomiting, diarrhea, abdominal pain, fecal incontinence Genitourinary: as above Musculoskeletal: No neck pain, back pain Integumentary: No rash, pruritus, skin lesions Neurological: as above Psychiatric: as above Endocrine: No palpitations, diaphoresis, change in appetite, change in weigh or increased thirst Hematologic/Lymphatic: No anemia, purpura, petechiae. Allergic/Immunologic: No itchy/runny eyes, nasal congestion, recent allergic reactions,  rashes  ALLERGIES: Allergies  Allergen Reactions  . Donepezil     "makes me mean"  . Latex     HOME MEDICATIONS:  Current Outpatient Medications:  .  busPIRone (BUSPAR) 15 MG tablet, Take 1 tablet (15 mg total) by mouth 2 (two) times daily., Disp: 180 tablet, Rfl: 3 .  Cholecalciferol 5000 UNITS TABS, Take 5,000 Units by mouth daily. , Disp: , Rfl:  .  Cyanocobalamin (VITAMIN B-12) 2500 MCG TABS, Take 2,500 mcg by mouth daily., Disp: 30 tablet, Rfl: 5 .  DOCOSAHEXAENOIC ACID PO, Take by mouth., Disp: , Rfl:  .  Docusate Calcium (STOOL SOFTENER PO), Take 1 tablet by mouth at bedtime., Disp: , Rfl:  .  GILENYA 0.5 MG CAPS, TAKE 1 CAPSULE DAILY, Disp: 90 capsule, Rfl: 3 .  lamoTRIgine (LAMICTAL) 150 MG tablet, Take 1 tablet twice daily, Disp: 180 tablet, Rfl: 3 .  levothyroxine (SYNTHROID, LEVOTHROID) 75 MCG tablet, , Disp: , Rfl:  .  lovastatin (MEVACOR) 10 MG tablet, Take 10 mg by mouth at bedtime., Disp: , Rfl:  .  Naproxen Sodium 220 MG CAPS, Take by mouth., Disp: , Rfl:  .  NUEDEXTA 20-10 MG capsule, Take 1 capsule by mouth twice daily, Disp: 60 capsule, Rfl: 11 .  oxybutynin (DITROPAN) 5 MG tablet, Take 1 tablet (5 mg total) by mouth 2 (two) times daily., Disp: 180 tablet, Rfl: 3 .  sertraline (ZOLOFT) 100 MG tablet, Take 1 tablet (100 mg total) by mouth daily., Disp: 90 tablet, Rfl: 3 .  QUEtiapine (SEROQUEL) 25 MG tablet, Take 1 tablet (25 mg total) by mouth at bedtime., Disp: 30 tablet, Rfl: 5  PAST MEDICAL HISTORY: Past Medical History:  Diagnosis Date  . Depressed   . Hyperlipemia   . Movement disorder   . MS (multiple sclerosis) ([place])   . Vision abnormalities     PAST SURGICAL HISTORY: Past Surgical History:  Procedure Laterality Date  . ABDOMINAL HYSTERECTOMY      FAMILY HISTORY: History reviewed. No pertinent family history.  SOCIAL HISTORY:  Social History   Socioeconomic History  . Marital status: Married    Spouse name: Not on file  . Number of  children: Not on file  . Years of education: Not on file  . Highest education level: Not on file  Occupational History  . Not on file  Tobacco Use  . Smoking status: Never Smoker  . Smokeless tobacco: Never Used  Substance and Sexual Activity  . Alcohol use: No  . Drug use: No  . Sexual activity: Never    Birth control/protection: Surgical  Other Topics Concern  . Not on file  Social History Narrative   Lives with husband, Engineer, technical sales   Social Determinants of Health   Financial Resource Strain:   . Difficulty of Paying Living Expenses: Not on file  Food Insecurity:   . Worried About Charity fundraiser in the Last Year: Not on file  . Ran Out of Food in the Last Year: Not on file  Transportation Needs:   . Lack of Transportation (Medical): Not on  file  . Lack of Transportation (Non-Medical): Not on file  Physical Activity:   . Days of Exercise per Week: Not on file  . Minutes of Exercise per Session: Not on file  Stress:   . Feeling of Stress : Not on file  Social Connections:   . Frequency of Communication with Friends and Family: Not on file  . Frequency of Social Gatherings with Friends and Family: Not on file  . Attends Religious Services: Not on file  . Active Member of Clubs or Organizations: Not on file  . Attends Archivist Meetings: Not on file  . Marital Status: Not on file  Intimate Partner Violence:   . Fear of Current or Ex-Partner: Not on file  . Emotionally Abused: Not on file  . Physically Abused: Not on file  . Sexually Abused: Not on file     PHYSICAL EXAM  Vitals:   07/11/20 0809  BP: 121/70  Pulse: 62  SpO2: 96%  Weight: 119 lb 8 oz (54.2 kg)  Height: 5\' 7"  (1.702 m)    Body mass index is 18.72 kg/m.    General: The patient is well-developed and well-nourished and in no acute distress.      Skin: Extremities are without rash or significant edema.   Neurologic Exam  Mental status: The patient is alert and oriented x 3 at the  time of the examination.  She has mildly reduced focus and attention.  She has mildly reduced memory and attention, similar to previous visits.  Speech is normal.  Cranial nerves: Extraocular movements are full.    Facial strength and sensation is normal.  Trapezius strength is normal   No obvious hearing deficits are noted.  Motor:  Muscle bulk is normal.   Muscle tone is increased in the legs, right greater than left.  Strength is 5/5.  Sensory: Sensory testing is intact to soft touch and vibration sensation in arms but reduced vibration  Coordination: Finger-nose-finger is performed fairly well bilaterally. However has reduced heel to shin on the right.    Gait and station: Station is normal.  The stance is wide.  She has a wide gait with a right foot drop.   She is unable to do tandem gait. Romberg is borderline.   Reflexes: Deep tendon reflexes are normal in the arms and increased in the legs.      ASSESSMENT AND PLAN    1. Multiple sclerosis ([place])   2. 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 Abnormal gait   4. High risk medication use   5. Insomnia, unspecified type   6. Depression with anxiety     1.   She will continue Gilenya.  We will check CBC and CMP today  2.   Continue other medications including buspirone, sertraline and lamotrigine.  I will add low dose Seroquel to help with insomnia and nighttime insomnia. 3.   I recommended PT for her gait issues that have slowly worsened,  Try to stay active and exercise as tolerated 4.   Return in 6 months or sooner if there are new or worsening neurologic symptoms.   Alcario Tinkey A. Felecia Shelling, MD, PhD 03/47/4259, 5:63 AM Certified in Neurology, Clinical Neurophysiology, Sleep Medicine, Pain Medicine and Neuroimaging  [facility] [address] [place], [place] [address] [phone]

## 2020-07-12 ENCOUNTER — Telehealth: Payer: Self-pay | Admitting: *Deleted

## 2020-07-12 LAB — HEPATIC FUNCTION PANEL
ALT: 14 IU/L (ref 0–32)
AST: 18 IU/L (ref 0–40)
Albumin: 4.7 g/dL (ref 3.8–4.8)
Alkaline Phosphatase: 72 IU/L (ref 44–121)
Bilirubin Total: 0.2 mg/dL (ref 0.0–1.2)
Bilirubin, Direct: <0.1 mg/dL (ref 0.00–0.40)
Total Protein: 7 g/dL (ref 6.0–8.5)

## 2020-07-12 LAB — CBC WITH DIFFERENTIAL/PLATELET
Basophils Absolute: 0 10*3/uL (ref 0.0–0.2)
Basos: 0 %
EOS (ABSOLUTE): 0 10*3/uL (ref 0.0–0.4)
Eos: 0 %
Hematocrit: 37.9 % (ref 34.0–46.6)
Hemoglobin: 12.4 g/dL (ref 11.1–15.9)
Immature Grans (Abs): 0 10*3/uL (ref 0.0–0.1)
Immature Granulocytes: 0 %
Lymphocytes Absolute: 0.4 10*3/uL — ABNORMAL LOW (ref 0.7–3.1)
Lymphs: 13 %
MCH: 31.4 pg (ref 26.6–33.0)
MCHC: 32.7 g/dL (ref 31.5–35.7)
MCV: 96 fL (ref 79–97)
Monocytes Absolute: 0.4 10*3/uL (ref 0.1–0.9)
Monocytes: 15 %
Neutrophils Absolute: 2.1 10*3/uL (ref 1.4–7.0)
Neutrophils: 72 %
Platelets: 299 10*3/uL (ref 150–450)
RBC: 3.95 x10E6/uL (ref 3.77–5.28)
RDW: 13.1 % (ref 11.7–15.4)
WBC: 2.9 10*3/uL — ABNORMAL LOW (ref 3.4–10.8)

## 2020-07-12 NOTE — Telephone Encounter (Signed)
Called and spoke with pt about results per Dr. Felecia Shelling note. She verbalized understanding.

## 2020-07-12 NOTE — Telephone Encounter (Signed)
-----   Message from Britt Bottom, MD sent at 07/12/2020  8:27 AM EDT ----- Please let the patient know that the lab work is fine. The lymphocytes and WBC are low but that is typical for Gilenya

## 2020-09-06 ENCOUNTER — Telehealth: Payer: Self-pay | Admitting: Neurology

## 2020-09-06 NOTE — Telephone Encounter (Signed)
Pt is asking for Emma,RN to call her re: her request for a DMV form .  Pt is asking if Dr Felecia Shelling can complete his portion and then have the form mailed to her since she would be unable to come to the office for it due to her not driving.

## 2020-09-06 NOTE — Telephone Encounter (Signed)
Called pt back. She is needing parking placard form. Requesting two. Advised she will check two placards on the form underneath her portion to fill out. Verified address on file. Advised it will be mailed out today or tomorrow for her once Dr. Felecia Shelling signs form. She verbalized understanding and appreciation.

## 2020-09-14 ENCOUNTER — Telehealth: Payer: Self-pay | Admitting: Neurology

## 2020-09-14 NOTE — Telephone Encounter (Signed)
I called pt and relayed that we do not have a date of disability for her.  We do not make that determination.  If SSD that is done thru filing of the [place] office and if needing records we can provide with release.  She appreciated call.

## 2020-09-14 NOTE — Telephone Encounter (Signed)
Pt. asks when did she become disabled. She asks for a call back.

## 2020-11-16 ENCOUNTER — Other Ambulatory Visit: Payer: Self-pay | Admitting: Neurology

## 2020-11-21 ENCOUNTER — Telehealth: Payer: Self-pay | Admitting: Neurology

## 2020-11-21 NOTE — Telephone Encounter (Signed)
Called pt back. Relayed Dr. Garth Bigness message. She verbalized understanding and appreciation for call.

## 2020-11-21 NOTE — Telephone Encounter (Signed)
Pt is asking for a call to discuss a new MS medication (does not know the name) she states in an advertisement there was a woman that went from a wheelchair to walking, she would like to hear what Dr Felecia Shelling says about that

## 2020-11-21 NOTE — Telephone Encounter (Signed)
Fenebrutinib is in clinical studies for MS.  It is not available yet.  There are also a couple other very similar medications in  clinical studies as well.  Hopefully 1 of these will be out in another year or 2.

## 2020-11-21 NOTE — Telephone Encounter (Signed)
Dr. Felecia Shelling- what are your thoughts on this? I am not familiar with this.

## 2020-11-21 NOTE — Telephone Encounter (Signed)
Pt has called back with the name of the medication: Fenebrutinib

## 2020-11-21 NOTE — Telephone Encounter (Signed)
LVM asking pt to find out which medication she wants to know more about and to call back and let us know. We can then discuss further with MD at that point. Provided office phone # for call back.

## 2020-11-28 ENCOUNTER — Encounter: Payer: Self-pay | Admitting: *Deleted

## 2020-11-28 ENCOUNTER — Telehealth: Payer: Self-pay | Admitting: Neurology

## 2020-11-28 NOTE — Telephone Encounter (Signed)
Placed letter in mail for pt. 

## 2020-11-28 NOTE — Telephone Encounter (Signed)
Called pt back. She is needing letter to excuse her from [employer] on 12/19/20.Confirmed address on file, she would like it mailed. Zipcode: [id number]

## 2020-11-28 NOTE — Telephone Encounter (Signed)
Pt called, need letter for jury duty due to my disability. Would like a call from the nurse

## 2020-12-06 ENCOUNTER — Telehealth: Payer: Self-pay | Admitting: Neurology

## 2020-12-06 NOTE — Telephone Encounter (Signed)
Called pt back. She states her daughter had another seizure. 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 Bumped head again. She is going to try and get her an appt set up at our office. Advised she will need referral from PCP first and once we receive this, we can schedule her an appt. She verbalized understanding.

## 2020-12-06 NOTE — Telephone Encounter (Signed)
Pt. didn't give a reason why, but asked that the RN calls her.

## 2020-12-12 ENCOUNTER — Telehealth: Payer: Self-pay | Admitting: Neurology

## 2020-12-12 NOTE — Telephone Encounter (Signed)
Completed Gilenya PA via CMM. Key: [id number].  Sent to [employer].  Should have a determination within 3-5 business days.  Marked as urgent.

## 2020-12-12 NOTE — Telephone Encounter (Signed)
Pt. is requesting a call from RN for a referral to insurance company. Please advise.

## 2020-12-12 NOTE — Telephone Encounter (Signed)
Called pt back. She states Gilenya needs PA. Has about 10 caps left. Order ready to ship. Confirmed she still has [place]. [facility] phone# [phone], fax# [phone]. Advised we will work on this for her.

## 2020-12-12 NOTE — Telephone Encounter (Signed)
PA for Gilenya was approved.   "CaseId:[id number];Status:Approved;Review Type:Prior Auth;Coverage Start Date:12/12/2020;Coverage End Date:12/12/2021;"  I called [employer].  They have the approval for Gilenya on file.  Patient needs to call them to schedule delivery.  I called patient.  I asked her to call [facility] to schedule delivery of Gilenya.  She was very Patent attorney.

## 2020-12-20 ENCOUNTER — Other Ambulatory Visit: Payer: Self-pay | Admitting: Neurology

## 2021-01-03 DIAGNOSIS — Z0279 Encounter for issue of other medical certificate: Secondary | ICD-10-CM

## 2021-01-04 ENCOUNTER — Telehealth: Payer: Self-pay | Admitting: *Deleted

## 2021-01-04 NOTE — Telephone Encounter (Signed)
Pt fmla form on [employer]

## 2021-01-04 NOTE — Telephone Encounter (Signed)
Gave completed/signed form back to medical records to process for pt. 

## 2021-01-09 ENCOUNTER — Telehealth: Payer: Self-pay | Admitting: *Deleted

## 2021-01-09 NOTE — Telephone Encounter (Signed)
Pt fmla form ready for p/u @ the front desk.  

## 2021-01-10 ENCOUNTER — Other Ambulatory Visit: Payer: Self-pay | Admitting: Neurology

## 2021-01-16 ENCOUNTER — Ambulatory Visit: Payer: Managed Care, Other (non HMO) | Admitting: Neurology

## 2021-01-26 ENCOUNTER — Encounter: Payer: Self-pay | Admitting: Neurology

## 2021-01-26 ENCOUNTER — Ambulatory Visit: Payer: Managed Care, Other (non HMO) | Admitting: Neurology

## 2021-01-26 VITALS — BP 121/70 | HR 64 | Ht 67.0 in | Wt 118.0 lb

## 2021-01-26 DIAGNOSIS — G35 Multiple sclerosis: Secondary | ICD-10-CM

## 2021-01-26 DIAGNOSIS — Z79899 Other long term (current) drug therapy: Secondary | ICD-10-CM | POA: Diagnosis not present

## 2021-01-26 DIAGNOSIS — R41840 Attention and concentration deficit: Secondary | ICD-10-CM

## 2021-01-26 DIAGNOSIS — R269 Unspecified abnormalities of gait and mobility: Secondary | ICD-10-CM

## 2021-01-26 DIAGNOSIS — F418 Other specified anxiety disorders: Secondary | ICD-10-CM

## 2021-01-26 DIAGNOSIS — F09 Unspecified mental disorder due to known physiological condition: Secondary | ICD-10-CM

## 2021-01-26 MED ORDER — METHYLPHENIDATE HCL 10 MG PO TABS: 10.0000 mg | ORAL_TABLET | Freq: Two times a day (BID) | ORAL | 0 refills | Status: DC

## 2021-01-26 MED ORDER — DALFAMPRIDINE ER 10 MG PO TB12: ORAL_TABLET | ORAL | 11 refills | Status: DC

## 2021-01-26 NOTE — Progress Notes (Signed)
[facility]  PATIENT: Dawn Keith DOB: 17-Dec-1960  REFERRING DOCTOR OR PCP:  Halford Chessman, PA-C SOURCE: patient  _________________________________   HISTORICAL  CHIEF COMPLAINT:  Chief Complaint  Patient presents with  . Follow-up    RM 13. Last seen 07/11/2020. On Gilenya for MS. Ambulates w/ cane.    HISTORY OF PRESENT ILLNESS:  Dawn Keith is a 60 y.o. woman with MS.   Update 01/26/2021: She is reporting more falls - often with her leg giving out.   She is on Gilenya and tolerates it well.  The MRIs of the brain and cervical spine performed last year did not show any new lesions.  She has no exacerbations but has had progression.     She is feeling weaker in both legs but right is worse.    She notes weakness and stiffness.  She often shakes while standing.   Around the house she furniture surfs.   Generally she feels worse during the afternoons and evenings with more falls.  She uses the walker more in her home and cane more outdoors   Denies numbness or dysesthesia. Bladder is doing much better with oxybutynin.    She has sleep onset and maintenance insomnia.   She takes Seroquel at night with benefit.     She has anxiety and depression.  She also has had pseudobulbar affect.  She rarely has delusions now   She takes buspirone, sertraline and lamotrigine and Nuedexta.   These hep incompletelyShe has decreased cognition.   She also reports fatigue.   Ritalin had helped some but she discontinued.    She has had the Covid vaccination April/May and will be getting the booster soon.     MS history:   She was diagnosed with MS in 2012 after several years of worsening gait.    She started on Gilenya and has tolerated it well and has had no defiinite exacerbation but has had progressive gait disturbance and cognitive issues.    IMAGING: MRI of the cervical spine 10/09/2018 without contrast shows multiple T2 hyperintense foci within the spinal cord as detailed  above.  Compared to the 2012 MRI, none of these appear to be new.  They are consistent with chronic demyelinating plaque associated with multiple sclerosis.  Also has multilevel degenerative changes as detailed above that does not lead to spinal stenosis or nerve root compression.  There has been mild progression of the degenerative changes at C4-C5 and C5-C6.  MRI of the brain 10/09/2018 without contrast shows multiple T2/flair hyperintense foci in the hemispheres, brainstem and spinal cord in a pattern and configuration consistent with chronic demyelinating plaque associated with multiple sclerosis.  None of the foci appears to be acute.     Moderate generalized cortical atrophy.      Compared to the MRI dated 08/20/2011, there are no definite new lesions.  The left middle cerebellar peduncle lesion is smaller on the current study.  The generalized cortical atrophy has slightly progressed.    REVIEW OF SYSTEMS: Constitutional: No fevers, chills, sweats, or change in appetite.  Notes fatigue Eyes: No visual changes, double vision, eye pain Ear, nose and throat: No hearing loss, ear pain, nasal congestion, sore throat Cardiovascular: No chest pain, palpitations Respiratory: No shortness of breath at rest or with exertion.   No wheezes.   Pneumonia in 2017 GastrointestinaI: No nausea, vomiting, diarrhea, abdominal pain, fecal incontinence Genitourinary: as above Musculoskeletal: No neck pain, back pain Integumentary: No rash, pruritus, skin  lesions Neurological: as above Psychiatric: as above Endocrine: No palpitations, diaphoresis, change in appetite, change in weigh or increased thirst Hematologic/Lymphatic: No anemia, purpura, petechiae. Allergic/Immunologic: No itchy/runny eyes, nasal congestion, recent allergic reactions, rashes  ALLERGIES: Allergies  Allergen Reactions  . Donepezil     "makes me mean"  . Latex     HOME MEDICATIONS:  Current Outpatient Medications:  .   busPIRone (BUSPAR) 15 MG tablet, Take 1 tablet by mouth twice daily, Disp: 180 tablet, Rfl: 0 .  Cholecalciferol 5000 UNITS TABS, Take 5,000 Units by mouth daily. , Disp: , Rfl:  .  Cyanocobalamin (VITAMIN B-12) 2500 MCG TABS, Take 2,500 mcg by mouth daily., Disp: 30 tablet, Rfl: 5 .  dalfampridine 10 MG TB12, One po q12 hours, Disp: 60 tablet, Rfl: 11 .  DOCOSAHEXAENOIC ACID PO, Take by mouth., Disp: , Rfl:  .  Docusate Calcium (STOOL SOFTENER PO), Take 1 tablet by mouth at bedtime., Disp: , Rfl:  .  GILENYA 0.5 MG CAPS, TAKE 1 CAPSULE DAILY, Disp: 90 capsule, Rfl: 3 .  lamoTRIgine (LAMICTAL) 150 MG tablet, Take 1 tablet by mouth twice daily, Disp: 180 tablet, Rfl: 0 .  levothyroxine (SYNTHROID, LEVOTHROID) 75 MCG tablet, , Disp: , Rfl:  .  lovastatin (MEVACOR) 10 MG tablet, Take 10 mg by mouth at bedtime., Disp: , Rfl:  .  methylphenidate (RITALIN) 10 MG tablet, Take 1 tablet (10 mg total) by mouth 2 (two) times daily., Disp: 60 tablet, Rfl: 0 .  Naproxen Sodium 220 MG CAPS, Take by mouth., Disp: , Rfl:  .  NUEDEXTA 20-10 MG capsule, Take 1 capsule by mouth twice daily, Disp: 60 capsule, Rfl: 11 .  oxybutynin (DITROPAN) 5 MG tablet, Take 1 tablet (5 mg total) by mouth 2 (two) times daily., Disp: 180 tablet, Rfl: 3 .  QUEtiapine (SEROQUEL) 25 MG tablet, Take 1 tablet (25 mg total) by mouth at bedtime., Disp: 30 tablet, Rfl: 5 .  sertraline (ZOLOFT) 100 MG tablet, Take 1 tablet (100 mg total) by mouth daily., Disp: 90 tablet, Rfl: 3  PAST MEDICAL HISTORY: Past Medical History:  Diagnosis Date  . Depressed   . Hyperlipemia   . Movement disorder   . MS (multiple sclerosis) ([place])   . Vision abnormalities     PAST SURGICAL HISTORY: Past Surgical History:  Procedure Laterality Date  . ABDOMINAL HYSTERECTOMY      FAMILY HISTORY: No family history on file.  SOCIAL HISTORY:  Social History   Socioeconomic History  . Marital status: Married    Spouse name: Not on file  . Number of  children: Not on file  . Years of education: Not on file  . Highest education level: Not on file  Occupational History  . Not on file  Tobacco Use  . Smoking status: Never Smoker  . Smokeless tobacco: Never Used  Substance and Sexual Activity  . Alcohol use: No  . Drug use: No  . Sexual activity: Never    Birth control/protection: Surgical  Other Topics Concern  . Not on file  Social History Narrative   Lives with husband, Engineer, technical sales   Social Determinants of Health   Financial Resource Strain: Not on file  Food Insecurity: Not on file  Transportation Needs: Not on file  Physical Activity: Not on file  Stress: Not on file  Social Connections: Not on file  Intimate Partner Violence: Not on file     PHYSICAL EXAM  Vitals:   01/26/21 0902  BP: 121/70  Pulse: 64  SpO2: 98%  Weight: 118 lb (53.5 kg)  Height: 5\' 7"  (1.702 m)    Body mass index is 18.48 kg/m.   General: The patient is well-developed and well-nourished and in no acute distress.      Skin: Extremities are without rash or significant edema.   Neurologic Exam  Mental status: The patient is alert and oriented x 3 at the time of the examination.  She has mildly reduced focus and attention.  She has mildly reduced memory and attention, similar to previous visits.  Speech is normal.  Cranial nerves: Extraocular movements are full.    Facial strength and sensation is normal.  Trapezius strength is normal   No obvious hearing deficits are noted.  Motor:  Muscle bulk is normal.   Muscle tone is increased in the legs, right greater than left.  Strength is 5/5.  Sensory: She had reduced vibration sensation in the legs relative to the arms.  Touch sensation was more normal  Coordination: Finger-nose-finger is performed fairly well bilaterally. However has reduced heel to shin on the right worse than left.    Gait and station: Station is normal.  She is able to walk in the room without a cane though is unsteady.  The  stance is wide.  She has a wide gait with a right foot drop.   She is unable to do tandem gait. Romberg is borderline.   Reflexes: Deep tendon reflexes are normal in the arms and increased in the legs.      ASSESSMENT AND PLAN    1. Multiple sclerosis ([place])   2. Attention deficit   3. High risk medication use   4. Abnormal gait   5. 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 Depression with anxiety     1.   She will continue Gilenya.  We will check CBC and CMP today.  2.   Continue other medications including buspirone, sertraline and lamotrigine.  She is sleeping better with low dose Seroquel to help with insomnia and nighttime anxiety.  I will add Adderall back as it had helped her in the past with the attention deficit and cognitive issues.. 3.   She will try Ampyra for the gait issues.  I prescribed this for her in early 9924 but she is uncertain if she ever actually tried it. 4.   Return in 6 months or sooner if there are new or worsening neurologic symptoms.  74 office visit with the majority of the time spent face-to-face for history and physical, discussion/counseling and decision-making.  Additional time with record review and documentation.  Dawn Keith A. Felecia Shelling, MD, PhD 10/31/8339, 96:22 AM Certified in Neurology, Clinical Neurophysiology, Sleep Medicine, Pain Medicine and Neuroimaging  [facility] [address], [place], [place] [address] [phone]

## 2021-01-27 LAB — CBC WITH DIFFERENTIAL/PLATELET
Basophils Absolute: 0 10*3/uL (ref 0.0–0.2)
Basos: 0 %
EOS (ABSOLUTE): 0 10*3/uL (ref 0.0–0.4)
Eos: 1 %
Hematocrit: 40.1 % (ref 34.0–46.6)
Hemoglobin: 13.3 g/dL (ref 11.1–15.9)
Immature Grans (Abs): 0 10*3/uL (ref 0.0–0.1)
Immature Granulocytes: 1 %
Lymphocytes Absolute: 0.4 10*3/uL — ABNORMAL LOW (ref 0.7–3.1)
Lymphs: 9 %
MCH: 32.1 pg (ref 26.6–33.0)
MCHC: 33.2 g/dL (ref 31.5–35.7)
MCV: 97 fL (ref 79–97)
Monocytes Absolute: 0.6 10*3/uL (ref 0.1–0.9)
Monocytes: 14 %
Neutrophils Absolute: 3.2 10*3/uL (ref 1.4–7.0)
Neutrophils: 75 %
Platelets: 331 10*3/uL (ref 150–450)
RBC: 4.14 x10E6/uL (ref 3.77–5.28)
RDW: 12.3 % (ref 11.7–15.4)
WBC: 4.2 10*3/uL (ref 3.4–10.8)

## 2021-01-27 LAB — COMPREHENSIVE METABOLIC PANEL
ALT: 21 IU/L (ref 0–32)
AST: 28 IU/L (ref 0–40)
Albumin/Globulin Ratio: 2.3 — ABNORMAL HIGH (ref 1.2–2.2)
Albumin: 4.8 g/dL (ref 3.8–4.8)
Alkaline Phosphatase: 95 IU/L (ref 44–121)
BUN/Creatinine Ratio: 13 (ref 12–28)
BUN: 12 mg/dL (ref 8–27)
Bilirubin Total: 0.3 mg/dL (ref 0.0–1.2)
CO2: 26 mmol/L (ref 20–29)
Calcium: 9.6 mg/dL (ref 8.7–10.3)
Chloride: 99 mmol/L (ref 96–106)
Creatinine, Ser: 0.96 mg/dL (ref 0.57–1.00)
Globulin, Total: 2.1 g/dL (ref 1.5–4.5)
Glucose: 86 mg/dL (ref 65–99)
Potassium: 5.6 mmol/L — ABNORMAL HIGH (ref 3.5–5.2)
Sodium: 138 mmol/L (ref 134–144)
Total Protein: 6.9 g/dL (ref 6.0–8.5)
eGFR: 67 mL/min/{1.73_m2} (ref >59)

## 2021-02-05 ENCOUNTER — Other Ambulatory Visit: Payer: Self-pay | Admitting: Neurology

## 2021-02-27 ENCOUNTER — Other Ambulatory Visit: Payer: Self-pay | Admitting: Neurology

## 2021-03-06 ENCOUNTER — Telehealth: Payer: Self-pay | Admitting: Neurology

## 2021-03-06 NOTE — Telephone Encounter (Signed)
Called pt back. Informed her refill already sent 02/27/21 #180 (90days supply). She will reach out to the pharmacy, nothing further needed.

## 2021-03-06 NOTE — Telephone Encounter (Signed)
Pt called needing a refill on her oxybutynin (DITROPAN) 5 MG tablet sent in to the [facility] on [address]

## 2021-03-21 ENCOUNTER — Other Ambulatory Visit: Payer: Self-pay | Admitting: Neurology

## 2021-04-15 ENCOUNTER — Other Ambulatory Visit: Payer: Self-pay | Admitting: Neurology

## 2021-05-26 ENCOUNTER — Other Ambulatory Visit: Payer: Self-pay | Admitting: Neurology

## 2021-05-27 ENCOUNTER — Other Ambulatory Visit: Payer: Self-pay | Admitting: Neurology

## 2021-05-30 ENCOUNTER — Telehealth: Payer: Self-pay | Admitting: Neurology

## 2021-05-30 MED ORDER — OXYBUTYNIN CHLORIDE 5 MG PO TABS: 5.0000 mg | ORAL_TABLET | Freq: Two times a day (BID) | ORAL | 1 refills | Status: DC

## 2021-05-30 NOTE — Telephone Encounter (Signed)
Pt called requesting refill for oxybutynin (DITROPAN) 5 MG tablet. [place] [phone].

## 2021-05-30 NOTE — Telephone Encounter (Signed)
E-scribed refill as requested. 

## 2021-06-06 ENCOUNTER — Telehealth: Payer: Self-pay | Admitting: Neurology

## 2021-06-06 NOTE — Telephone Encounter (Signed)
Pt called, having headaches since yesterday, feeling weak. Dawn Keith taking to Urgent Care. Would like a call to the nurse.

## 2021-06-06 NOTE — Telephone Encounter (Signed)
Called the patient back and she has had last 2-3 days a bad headache that has been non stop. She accepted an apt for thur 15th at 2:00 pm with check in of 1:30 pm.

## 2021-06-08 ENCOUNTER — Other Ambulatory Visit: Payer: Self-pay

## 2021-06-08 ENCOUNTER — Encounter: Payer: Self-pay | Admitting: Neurology

## 2021-06-08 ENCOUNTER — Ambulatory Visit: Payer: Managed Care, Other (non HMO) | Admitting: Neurology

## 2021-06-08 VITALS — BP 134/80 | HR 63 | Ht 67.0 in | Wt 112.0 lb

## 2021-06-08 DIAGNOSIS — M542 Cervicalgia: Secondary | ICD-10-CM | POA: Diagnosis not present

## 2021-06-08 DIAGNOSIS — R269 Unspecified abnormalities of gait and mobility: Secondary | ICD-10-CM

## 2021-06-08 DIAGNOSIS — R5383 Other fatigue: Secondary | ICD-10-CM | POA: Diagnosis not present

## 2021-06-08 DIAGNOSIS — F418 Other specified anxiety disorders: Secondary | ICD-10-CM | POA: Diagnosis not present

## 2021-06-08 DIAGNOSIS — F482 Pseudobulbar affect: Secondary | ICD-10-CM

## 2021-06-08 DIAGNOSIS — G35 Multiple sclerosis: Secondary | ICD-10-CM

## 2021-06-08 DIAGNOSIS — G4489 Other headache syndrome: Secondary | ICD-10-CM

## 2021-06-08 DIAGNOSIS — F09 Unspecified mental disorder due to known physiological condition: Secondary | ICD-10-CM

## 2021-06-08 DIAGNOSIS — R41840 Attention and concentration deficit: Secondary | ICD-10-CM

## 2021-06-08 DIAGNOSIS — Z79899 Other long term (current) drug therapy: Secondary | ICD-10-CM

## 2021-06-08 NOTE — Progress Notes (Signed)
[facility]  PATIENT: Dawn Keith DOB: 1961-03-01  REFERRING DOCTOR OR PCP:  Halford Chessman, PA-C SOURCE: patient  _________________________________   HISTORICAL  CHIEF COMPLAINT:  Chief Complaint  Patient presents with   Follow-up    Rm 1, w her friend Tammy. Here for HA f/u. C/o of HA for the last month and worsened within the last week. Taking tylenol. Pt is asking if she is due for an MRI.     HISTORY OF PRESENT ILLNESS:  Dawn Keith is a 60 y.o. woman with MS.   Update 59/15/2022: She is on Gilenya and tolerates it well.  The MRIs of the brain and cervical spine performed in 2020 did not show any new lesions.  She has no exacerbations but has had progression.     Due to poor balance, she has had some falls.   She also notes spasticity and weakness in both legs ad t a lesser extent in her arms.   She often shakes while standing.   Around the house she furniture surfs.   Generally she feels worse during the afternoons and evenings with more falls.  She uses the walker more in her home and cane more outdoors   Denies numbness or dysesthesia. Bladder is doing much better with oxybutynin.    She has sleep onset and maintenance insomnia.   She takes Seroquel at night with benefit.      She has anxiety and depression.  She also has had pseudobulbar affect.  She rarely has delusions now   She takes buspirone, sertraline and lamotrigine and Nuedexta.    She has decreased cognition.   She also reports fatigue.   R   She is getting more headaches in the temples, occiput  region.    They worsened about 1 month ago.  THey are located bilaterally.   Moving does not change pain much but makes her dizzy.     Tylenol takes the edge off some.   No N/V.  Only mild photophobia.   No phonophobia.      MS history:   She was diagnosed with MS in 2012 after several years of worsening gait.    She started on Gilenya and has tolerated it well and has had no defiinite exacerbation but  has had progressive gait disturbance and cognitive issues.    IMAGING: MRI of the cervical spine 10/09/2018 without contrast shows multiple T2 hyperintense foci within the spinal cord as detailed above.  Compared to the 2012 MRI, none of these appear to be new.  They are consistent with chronic demyelinating plaque associated with multiple sclerosis.  Also has multilevel degenerative changes as detailed above that does not lead to spinal stenosis or nerve root compression.  There has been mild progression of the degenerative changes at C4-C5 and C5-C6.  MRI of the brain 10/09/2018 without contrast shows multiple T2/flair hyperintense foci in the hemispheres, brainstem and spinal cord in a pattern and configuration consistent with chronic demyelinating plaque associated with multiple sclerosis.  None of the foci appears to be acute.     Moderate generalized cortical atrophy.      Compared to the MRI dated 08/20/2011, there are no definite new lesions.  The left middle cerebellar peduncle lesion is smaller on the current study.  The generalized cortical atrophy has slightly progressed.    REVIEW OF SYSTEMS: Constitutional: No fevers, chills, sweats, or change in appetite.  Notes fatigue Eyes: No visual changes, double vision, eye pain Ear,  nose and throat: No hearing loss, ear pain, nasal congestion, sore throat Cardiovascular: No chest pain, palpitations Respiratory:  No shortness of breath at rest or with exertion.   No wheezes.   Pneumonia in 2017 GastrointestinaI: No nausea, vomiting, diarrhea, abdominal pain, fecal incontinence Genitourinary:  as above Musculoskeletal:  No neck pain, back pain Integumentary: No rash, pruritus, skin lesions Neurological: as above Psychiatric: as above Endocrine: No palpitations, diaphoresis, change in appetite, change in weigh or increased thirst Hematologic/Lymphatic:  No anemia, purpura, petechiae. Allergic/Immunologic: No itchy/runny eyes, nasal congestion,  recent allergic reactions, rashes  ALLERGIES: Allergies  Allergen Reactions   Donepezil     "makes me mean"   Latex     HOME MEDICATIONS:  Current Outpatient Medications:    busPIRone (BUSPAR) 15 MG tablet, TAKE 1 TABLET BY MOUTH TWICE DAILY(MUST KIEEP FOLLOW UP 01/16/2021 FOR ONGOING REFILLS), Disp: 180 tablet, Rfl: 1   Cholecalciferol 5000 UNITS TABS, Take 5,000 Units by mouth daily. , Disp: , Rfl:    Cyanocobalamin (VITAMIN B-12) 2500 MCG TABS, Take 2,500 mcg by mouth daily., Disp: 30 tablet, Rfl: 5   DOCOSAHEXAENOIC ACID PO, Take by mouth., Disp: , Rfl:    Docusate Calcium (STOOL SOFTENER PO), Take 1 tablet by mouth at bedtime., Disp: , Rfl:    GILENYA 0.5 MG CAPS, TAKE 1 CAPSULE DAILY, Disp: 90 capsule, Rfl: 3   lamoTRIgine (LAMICTAL) 150 MG tablet, Take 1 tablet by mouth twice daily, Disp: 180 tablet, Rfl: 1   levothyroxine (SYNTHROID, LEVOTHROID) 75 MCG tablet, , Disp: , Rfl:    lovastatin (MEVACOR) 10 MG tablet, Take 10 mg by mouth at bedtime., Disp: , Rfl:    Naproxen Sodium 220 MG CAPS, Take by mouth., Disp: , Rfl:    NUEDEXTA 20-10 MG capsule, Take 1 capsule by mouth twice daily, Disp: 60 capsule, Rfl: 11   oxybutynin (DITROPAN) 5 MG tablet, Take 1 tablet (5 mg total) by mouth 2 (two) times daily., Disp: 180 tablet, Rfl: 1   sertraline (ZOLOFT) 100 MG tablet, Take 1 tablet by mouth once daily, Disp: 90 tablet, Rfl: 3  PAST MEDICAL HISTORY: Past Medical History:  Diagnosis Date   Depressed    Hyperlipemia    Movement disorder    MS (multiple sclerosis) ([place])    Vision abnormalities     PAST SURGICAL HISTORY: Past Surgical History:  Procedure Laterality Date   ABDOMINAL HYSTERECTOMY      FAMILY HISTORY: History reviewed. No pertinent family history.  SOCIAL HISTORY:  Social History   Socioeconomic History   Marital status: Married    Spouse name: Not on file   Number of children: Not on file   Years of education: Not on file   Highest education level:  Not on file  Occupational History   Not on file  Tobacco Use   Smoking status: Never   Smokeless tobacco: Never  Substance and Sexual Activity   Alcohol use: No   Drug use: No   Sexual activity: Never    Birth control/protection: Surgical  Other Topics Concern   Not on file  Social History Narrative   Lives with husband, Engineer, technical sales   Social Determinants of Health   Financial Resource Strain: Not on file  Food Insecurity: Not on file  Transportation Needs: Not on file  Physical Activity: Not on file  Stress: Not on file  Social Connections: Not on file  Intimate Partner Violence: Not on file     PHYSICAL EXAM  Vitals:  06/08/21 1349  BP: 134/80  Pulse: 63  Weight: 112 lb (50.8 kg)  Height: '5\' 7"'$  (1.702 m)    Body mass index is 17.54 kg/m.   General: The patient is well-developed and well-nourished and in no acute distress.     Tender over bilateral occiput/splenius capitus.     Skin: Extremities are without rash or significant edema.   Neurologic Exam  Mental status: The patient is alert and oriented x 3 at the time of the examination.  She has reduced focus and attention.  She has mildly reduced memory and attention, similar to previous visits.  Speech is normal except some word finding pauses  Cranial nerves: Extraocular movements are full.   Mildly reduced color saturation OS.   Facial strength and sensation is normal.  Trapezius strength is normal   No obvious hearing deficits are noted.  Motor:  Muscle bulk is normal.   Muscle tone is increased in the legs, right greater than left.  Strength is 5/5.  Sensory: She had reduced vibration sensation in the legs relative to the arms.  Touch sensation was more normal  Coordination: Finger-nose-finger is performed fairly normally but reduced heel to shin, worse on right.  .   Gait and station: Station is normal.  She is able to walk in the room without a cane though is unsteady.  The stance is wide.  She has a wide  gait with a  mild right foot drop.   She is unable to do tandem gait. Romberg is borderline.   Reflexes: Deep tendon reflexes are normal in the arms and increased in the legs.      ASSESSMENT AND PLAN    1. 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 Depression with anxiety   3. Pseudobulbar affect   4. Other fatigue   5. Abnormal gait   6. Attention deficit   7. High risk medication use   8. Neck pain   9. Other headache syndrome      1.   She will continue Gilenya for her active SPMS.  Labs were checked 01/2021 so can hold off.    Check MRI of the brain.  If subclinical progression, we will consider a different DMT.    2.   Continue other medications including buspirone, sertraline and lamotrigine and Nuedexta 3.   TPI bilateral splenius capitus muscles wit 80 mg Depo-Medrol 4.   Return in 6 months or sooner if there are new or worsening neurologic symptoms.   Ezreal Turay A. Felecia Shelling, MD, PhD [id number], [id number] PM Certified in Neurology, Clinical Neurophysiology, Sleep Medicine, Pain Medicine and Neuroimaging  [facility] [address] [place], [place] [address] [phone]

## 2021-06-14 ENCOUNTER — Telehealth: Payer: Self-pay | Admitting: Neurology

## 2021-06-14 MED ORDER — LORAZEPAM 0.5 MG PO TABS: ORAL_TABLET | ORAL | 0 refills | Status: DC

## 2021-06-14 NOTE — Addendum Note (Signed)
Addended by: Wyvonnia Lora on: 06/14/2021 11:34 AM   Modules accepted: Orders

## 2021-06-14 NOTE — Telephone Encounter (Signed)
MR Brain w/wo contrast Dr. Corena Pilgrim: [id number] (exp. 06/14/21 to 12/11/21) Patient is scheduled at [facility] for 06/20/21.  She also informed me she is claustrophobic and informed me that she needs something to help her. She is aware to have a driver.

## 2021-06-14 NOTE — Addendum Note (Signed)
Addended by: Arlice Colt A on: 06/14/2021 12:13 PM   Modules accepted: Orders

## 2021-06-20 ENCOUNTER — Ambulatory Visit: Payer: Managed Care, Other (non HMO)

## 2021-06-20 MED ORDER — LORAZEPAM 0.5 MG PO TABS: ORAL_TABLET | ORAL | 0 refills | Status: DC

## 2021-06-20 NOTE — Telephone Encounter (Signed)
Dr. Felecia Shelling sent in the prescription for her today. Thank you

## 2021-06-20 NOTE — Addendum Note (Signed)
Addended by: Arlice Colt A on: 06/20/2021 10:43 AM   Modules accepted: Orders

## 2021-06-20 NOTE — Telephone Encounter (Signed)
Patient came to the office today for her MRI, but said she never had anything sent in to her pharmacy about something she needed because she is claustrophobic.   Patient is rescheduled for tomorrow 06/21/21 at [facility].

## 2021-06-21 ENCOUNTER — Ambulatory Visit (INDEPENDENT_AMBULATORY_CARE_PROVIDER_SITE_OTHER): Payer: Managed Care, Other (non HMO)

## 2021-06-21 ENCOUNTER — Other Ambulatory Visit: Payer: Self-pay

## 2021-06-21 DIAGNOSIS — F09 Unspecified mental disorder due to known physiological condition: Secondary | ICD-10-CM | POA: Diagnosis not present

## 2021-06-21 DIAGNOSIS — G35 Multiple sclerosis: Secondary | ICD-10-CM | POA: Diagnosis not present

## 2021-06-21 MED ORDER — GADOBENATE DIMEGLUMINE 529 MG/ML IV SOLN
3.0000 mL | Freq: Once | INTRAVENOUS | Status: AC | PRN
  Administered 2021-06-21: 3 mL via INTRAVENOUS

## 2021-06-22 ENCOUNTER — Telehealth: Payer: Self-pay | Admitting: Neurology

## 2021-06-22 DIAGNOSIS — S065X0A Traumatic subdural hemorrhage without loss of consciousness, initial encounter: Secondary | ICD-10-CM

## 2021-06-22 NOTE — Telephone Encounter (Signed)
I discussed the findings of the MRI with Dawn Keith.  It shows that compared to 2019 the MS is stable.  Specifically, though she has a fair number of MS plaques there did not appear to be any new ones.  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  She reports 2 months ago having 2 falls one right after another hitting her head both times and it is likely that 1 of those falls led to the bleed.  There was no mass-effect on the brain and there was not shift.  Therefore, what I would like to do is to reimage her in 1 month to make sure that there is no worsening.  She is advised to let us know or go to the emergency room if he has significant change in her neurologic.

## 2021-06-23 ENCOUNTER — Telehealth: Payer: Self-pay | Admitting: Neurology

## 2021-06-23 NOTE — Telephone Encounter (Signed)
[employer] order sent to [facility], they will obtain the auth and reach out to the patient to schedule.

## 2021-07-13 ENCOUNTER — Ambulatory Visit
Admission: RE | Admit: 2021-07-13 | Discharge: 2021-07-13 | Disposition: A | Discharge status: Home / Self Care | Payer: Managed Care, Other (non HMO) | Source: Ambulatory Visit | Attending: Neurology | Admitting: Neurology

## 2021-07-13 DIAGNOSIS — S065X0A Traumatic subdural hemorrhage without loss of consciousness, initial encounter: Secondary | ICD-10-CM | POA: Diagnosis not present

## 2021-08-07 ENCOUNTER — Ambulatory Visit: Payer: Managed Care, Other (non HMO) | Admitting: Neurology

## 2021-09-13 ENCOUNTER — Telehealth: Payer: Self-pay | Admitting: Neurology

## 2021-09-13 NOTE — Telephone Encounter (Signed)
Called pt back. Misplaced a few pills of oxybutynin. She has plenty left but worried about running out early. Aware we can always call in short term supply (if MD approves) for her to fill w goodrx coupon. #60/30 is about 10/11 [place]ards at [facility]. Patient agreeable to this plan. She will call once she gets closer to needing refill. Nothing further needed.

## 2021-09-13 NOTE — Telephone Encounter (Signed)
Pt called wanting to discuss a possible substitution for her oxybutynin (DITROPAN) 5 MG tablet. Please advise.

## 2021-09-20 ENCOUNTER — Other Ambulatory Visit: Payer: Self-pay | Admitting: Neurology

## 2021-09-26 ENCOUNTER — Telehealth: Payer: Self-pay | Admitting: Neurology

## 2021-09-26 MED ORDER — MECLIZINE HCL 25 MG PO TABS: 25.0000 mg | ORAL_TABLET | Freq: Three times a day (TID) | ORAL | 0 refills | Status: DC | PRN

## 2021-09-26 NOTE — Telephone Encounter (Signed)
Called the pt and advised that Dr Felecia Shelling recommends trying first meclizine up to three times a day as needed for the vertigo symptoms to see if any benefit is noted. Advised if pt does not notice benefit then let us know and we can do order for vestibular rehab. Pt verbalized understanding. Pt had no questions at this time but was encouraged to call back if questions arise.

## 2021-09-26 NOTE — Addendum Note (Signed)
Addended by: Darleen Crocker on: 09/26/2021 11:55 AM   Modules accepted: Orders

## 2021-09-26 NOTE — Telephone Encounter (Signed)
Called the patient back and she states that this has been an ongoing concern but has noticed worsening symptoms. She states that when she turns her head side to side her head becomes so spinny and she gets nauseous. She described if she looks up with her head that also causes this feeling. She doesn't recall if she has tried anything in the past for her. I advised I would discuss with Dr Felecia Shelling to get his thoughts on what he would recommend. He may offer meclizine or vestibular rehab. Advised I will call the pt back once discussed.

## 2021-09-26 NOTE — Telephone Encounter (Signed)
Pt called wanting to be advised what she can do or take for Vertigo. Pt states that when she moves her head side to side she gets real sick like if she is having a seizure. Please advise.

## 2021-10-06 ENCOUNTER — Other Ambulatory Visit: Payer: Self-pay | Admitting: Neurology

## 2021-11-05 HISTORY — PX: TOTAL HIP ARTHROPLASTY: SHX124

## 2021-11-09 ENCOUNTER — Telehealth: Payer: Self-pay | Admitting: *Deleted

## 2021-11-09 NOTE — Telephone Encounter (Signed)
Faxed completed/signed PA to [place] at [phone]. Received fax confirmation, waiting on determination.

## 2021-11-15 NOTE — Telephone Encounter (Signed)
Called [employer] at [phone] to check on status of PA. Spoke w/ Linus Orn. Case [id number]. Effective 11/09/21-11/09/22.

## 2021-11-29 ENCOUNTER — Other Ambulatory Visit: Payer: Self-pay | Admitting: Neurology

## 2021-12-05 ENCOUNTER — Telehealth: Payer: Self-pay | Admitting: Neurology

## 2021-12-05 ENCOUNTER — Other Ambulatory Visit: Payer: Self-pay | Admitting: Neurology

## 2021-12-05 MED ORDER — MECLIZINE HCL 25 MG PO TABS: 25.0000 mg | ORAL_TABLET | Freq: Three times a day (TID) | ORAL | 5 refills | Status: DC | PRN

## 2021-12-05 NOTE — Telephone Encounter (Signed)
Pt request refill for meclizine (ANTIVERT) 25 MG tablet at [place] 0511 ?

## 2021-12-05 NOTE — Telephone Encounter (Signed)
Refill sent for the pt 

## 2021-12-12 ENCOUNTER — Encounter: Payer: Self-pay | Admitting: Neurology

## 2021-12-12 ENCOUNTER — Ambulatory Visit: Payer: Managed Care, Other (non HMO) | Admitting: Neurology

## 2021-12-12 VITALS — BP 123/78 | HR 77 | Ht 67.0 in | Wt 110.0 lb

## 2021-12-12 DIAGNOSIS — F482 Pseudobulbar affect: Secondary | ICD-10-CM | POA: Diagnosis not present

## 2021-12-12 DIAGNOSIS — R41840 Attention and concentration deficit: Secondary | ICD-10-CM

## 2021-12-12 DIAGNOSIS — G35 Multiple sclerosis: Secondary | ICD-10-CM

## 2021-12-12 DIAGNOSIS — F418 Other specified anxiety disorders: Secondary | ICD-10-CM | POA: Diagnosis not present

## 2021-12-12 DIAGNOSIS — F09 Unspecified mental disorder due to known physiological condition: Secondary | ICD-10-CM

## 2021-12-12 DIAGNOSIS — Z8679 Personal history of other diseases of the circulatory system: Secondary | ICD-10-CM | POA: Insufficient documentation

## 2021-12-12 DIAGNOSIS — R5383 Other fatigue: Secondary | ICD-10-CM

## 2021-12-12 DIAGNOSIS — Z79899 Other long term (current) drug therapy: Secondary | ICD-10-CM

## 2021-12-12 DIAGNOSIS — R269 Unspecified abnormalities of gait and mobility: Secondary | ICD-10-CM

## 2021-12-12 MED ORDER — MECLIZINE HCL 25 MG PO TABS: 25.0000 mg | ORAL_TABLET | Freq: Three times a day (TID) | ORAL | 11 refills | Status: DC | PRN

## 2021-12-12 MED ORDER — QUETIAPINE FUMARATE 25 MG PO TABS: 25.0000 mg | ORAL_TABLET | Freq: Every day | ORAL | 11 refills | Status: DC

## 2021-12-12 NOTE — Progress Notes (Signed)
? ?[facility] ? ?PATIENT: Dawn Keith ?DOB: October 26, 1960 ? ?REFERRING DOCTOR OR PCP:  Halford Chessman, PA-C ?SOURCE: patient ? ?_________________________________ ? ? ?HISTORICAL ? ?CHIEF COMPLAINT:  ?Chief Complaint  ?Patient presents with  ? Follow-up  ?  Rm 1, w friend. Here for 6 month MS f/u, on Gilenya and tolerating well.   ? ? ?HISTORY OF PRESENT ILLNESS:  ?Dawn Keith is a 61 y.o. woman with MS.  ? ?Update 3/32/2023: ?She fell when her right foot caught a door frame and she broke her hip.   She had a partial replacement.   She went to Rehab but had a bad experience and left after a few days.   She has had a slow recovery.  She was in a wheelchair x weeks and now is usinf a walker.    She is able to go >100 feet now on a flat surface but not on carpet.  She has had a few falls.    If she falls, she needs help to get back up.     She previously had fallen and broken her right wrist.    Handwriting is poor since then.    ? ?Last year after a fall gait worsened and MRI showed that she had a subdural hematoma.  It was followed and had a better appearance on CT a month later ? ?She is trembling more in her hands and feet.    Her family is noting she has more trouble with cognitive tasks, decision making, etc.   She has trouble remembering wheat she was planning on doing.   She does better with a hint. ? ?She is on Gilenya and tolerates it well.  The MRIs of the brain and cervical spine performed in 2020 did not show any new lesions.  She has no exacerbations but has had progression.    ? ?Besides the worsening difficulty with gait, she notes spasticity and weakness in both legs and mild weakness in the arms.  She has a tremor in her hands.   She often shakes while standing.   Denies numbness or dysesthesia. Bladder is doing better with oxybutynin.  She has vertigo and feels sick from this with nausea.   She has had constipation and takes Miralax, stool softeners and dulcolax.   With that he has one BM  a day but is concerned that she still feels constipated.   Xrays had not show any impaction.    ? ?She has paranoia.   She was seeing psychiatry. She is on lamotrigine, buspar, quetiapine and Nuedexta.   ? ?She has sleep onset and maintenance insomnia.   She takes Seroquel at night with benefit.      She has anxiety and depression.  She also has had pseudobulbar affect.  She rarely has delusions now since medications were modified.  She takes buspirone, sertraline and lamotrigine and Nuedexta.    She has decreased cognition.   She also reports fatigue.    ? ?She has headaches at times..     Tylenol takes the edge off some.   No N/V.  Only mild photophobia.   No phonophobia.   ? ? ?MS history:   ?She was diagnosed with MS in 2012 after several years of worsening gait.    She started on Gilenya and has tolerated it well and has had no defiinite exacerbation but has had progressive gait disturbance and cognitive issues.   ? ?IMAGING: ?MRI of the cervical spine 10/09/2018 without contrast  shows multiple T2 hyperintense foci within the spinal cord as detailed above.  Compared to the 2012 MRI, none of these appear to be new.  They are consistent with chronic demyelinating plaque associated with multiple sclerosis.  Also has multilevel degenerative changes as detailed above that does not lead to spinal stenosis or nerve root compression.  There has been mild progression of the degenerative changes at C4-C5 and C5-C6. ? ?MRI of the brain 10/09/2018 without contrast shows multiple T2/flair hyperintense foci in the hemispheres, brainstem and spinal cord in a pattern and configuration consistent with chronic demyelinating plaque associated with multiple sclerosis.  None of the foci appears to be acute.     Moderate generalized cortical atrophy.      Compared to the MRI dated 08/20/2011, there are no definite new lesions.  The left middle cerebellar peduncle lesion is smaller on the current study.  The generalized cortical  atrophy has slightly progressed. ?  ? ?REVIEW OF SYSTEMS: ?Constitutional: No fevers, chills, sweats, or change in appetite.  Notes fatigue ?Eyes: No visual changes, double vision, eye pain ?Ear, nose and throat: No hearing loss, ear pain, nasal congestion, sore throat ?Cardiovascular: No chest pain, palpitations ?Respiratory:  No shortness of breath at rest or with exertion.   No wheezes.   Pneumonia in 2017 ?GastrointestinaI: No nausea, vomiting, diarrhea, abdominal pain, fecal incontinence ?Genitourinary:  as above ?Musculoskeletal:  No neck pain, back pain ?Integumentary: No rash, pruritus, skin lesions ?Neurological: as above ?Psychiatric: as above ?Endocrine: No palpitations, diaphoresis, change in appetite, change in weigh or increased thirst ?Hematologic/Lymphatic:  No anemia, purpura, petechiae. ?Allergic/Immunologic: No itchy/runny eyes, nasal congestion, recent allergic reactions, rashes ? ?ALLERGIES: ?Allergies  ?Allergen Reactions  ? Donepezil   ?  "makes me mean"  ? Latex   ? ? ?HOME MEDICATIONS: ? ?Current Outpatient Medications:  ?  acetaminophen (TYLENOL) 500 MG tablet, Take 2 tablets by mouth every morning. Alternates with ibup., Disp: , Rfl:  ?  bisacodyl (DULCOLAX) 10 MG suppository, Place 1 suppository rectally every morning., Disp: , Rfl:  ?  busPIRone (BUSPAR) 15 MG tablet, Take 1 tablet (15 mg total) by mouth 2 (two) times daily., Disp: 180 tablet, Rfl: 1 ?  Cholecalciferol 5000 UNITS TABS, Take 5,000 Units by mouth daily. , Disp: , Rfl:  ?  Cyanocobalamin (VITAMIN B-12) 2500 MCG TABS, Take 2,500 mcg by mouth daily., Disp: 30 tablet, Rfl: 5 ?  Docusate Calcium (STOOL SOFTENER PO), Take 3 tablets by mouth every morning., Disp: , Rfl:  ?  GILENYA 0.5 MG CAPS, TAKE 1 CAPSULE DAILY, Disp: 90 capsule, Rfl: 3 ?  lamoTRIgine (LAMICTAL) 150 MG tablet, Take 1 tablet by mouth twice daily, Disp: 180 tablet, Rfl: 1 ?  levothyroxine (SYNTHROID, LEVOTHROID) 75 MCG tablet, , Disp: , Rfl:  ?  Magnesium  Hydroxide (DULCOLAX SOFT CHEWS) 1200 MG CHEW, Chew 2 tablets by mouth in the morning., Disp: , Rfl:  ?  Naproxen Sodium 220 MG CAPS, Take by mouth., Disp: , Rfl:  ?  NUEDEXTA 20-10 MG capsule, Take 1 capsule by mouth twice daily, Disp: 60 capsule, Rfl: 11 ?  oxybutynin (DITROPAN) 5 MG tablet, Take 1 tablet (5 mg total) by mouth 2 (two) times daily., Disp: 180 tablet, Rfl: 1 ?  polyethylene glycol powder (GLYCOLAX/MIRALAX) 17 GM/SCOOP powder, Take 17 g by mouth every morning. 1cap every AM, Disp: , Rfl:  ?  Prenatal MV & Min w/FA-DHA (PRENATAL ADULT GUMMY/DHA/FA) 0.4-25 MG CHEW, Chew 2 each by mouth in  the morning., Disp: , Rfl:  ?  QUEtiapine (SEROQUEL) 25 MG tablet, Take 1 tablet (25 mg total) by mouth at bedtime., Disp: 30 tablet, Rfl: 11 ?  sertraline (ZOLOFT) 100 MG tablet, Take 1 tablet by mouth once daily, Disp: 90 tablet, Rfl: 3 ?  VITAMIN E PO, Take 1 capsule by mouth in the morning., Disp: , Rfl:  ?  lovastatin (MEVACOR) 10 MG tablet, Take 10 mg by mouth at bedtime. (Patient not taking: Reported on 12/12/2021), Disp: , Rfl:  ?  meclizine (ANTIVERT) 25 MG tablet, Take 1 tablet (25 mg total) by mouth 3 (three) times daily as needed for dizziness., Disp: 60 tablet, Rfl: 11 ? ?PAST MEDICAL HISTORY: ?Past Medical History:  ?Diagnosis Date  ? Depressed   ? Hyperlipemia   ? Movement disorder   ? MS (multiple sclerosis) ([place])   ? Vision abnormalities   ? ? ?PAST SURGICAL HISTORY: ?Past Surgical History:  ?Procedure Laterality Date  ? ABDOMINAL HYSTERECTOMY    ? ? ?FAMILY HISTORY: ?History reviewed. No pertinent family history. ? ?SOCIAL HISTORY: ? ?Social History  ? ?Socioeconomic History  ? Marital status: Married  ?  Spouse name: Not on file  ? Number of children: Not on file  ? Years of education: Not on file  ? Highest education level: Not on file  ?Occupational History  ? Not on file  ?Tobacco Use  ? Smoking status: Never  ? Smokeless tobacco: Never  ?Substance and Sexual Activity  ? Alcohol use: No  ? Drug  use: No  ? Sexual activity: Never  ?  Birth control/protection: Surgical  ?Other Topics Concern  ? Not on file  ?Social History Narrative  ? Lives with husband, Rush Landmark  ? ?Social Determinants of Health  ? ?Financial

## 2021-12-18 ENCOUNTER — Telehealth: Payer: Self-pay | Admitting: Neurology

## 2021-12-18 NOTE — Telephone Encounter (Signed)
Pt's friend/caretaker Tammy called stating she is needing to speak to the RN or Provider to get advise. Tammy called stating that the pt's symptoms are getting worse and she is needing help. She states that there tremors are getting worse, she is pacing for hours, hearing voices very jittery and will take off her clothes and just stand in the middle of the floor naked and not moving. Please advise.  ?

## 2021-12-18 NOTE — Telephone Encounter (Signed)
Called Tammy back to get further information. She states she spoke with Dr. Felecia Shelling about her being off/fidgety. She is sitting on toilet and not urinating or having BM. Sat on toilet for five hours. Cannot get her off toilet. If they get her off, she'll sit back down. Pacing all day and all night w/ walker.  ?She said Dr. Felecia Shelling suggested cutting back on Gilenya to qod which they have done. She has been progressively getting worse since last visit. She walked out of house in middle of night. They could not get her to come back in and  had to pick her up to bring her back in. Pt says someone is talking to her telling her what to do. When pt looks at her, has look that she isn't sure who she is. However, Tammy states her phone will ring and she acts fine when she is talking on the phone. When she hangs up, confusion starts back. She is reaching for things that are not there. ?Just finished antibiotics for UTI. Supposed to go back today for lab at PCP but had to cx appt d.t severity of sx. Has appt Friday with them. Advised UTI may still be present even with antibiotics. She did not feel she could bring her to appt. ? ?Bill/spouse said this happened years ago. Tammy said that he said it had something to do with her medication.  ? ?Advised MD may want them to bring her to ER for immediate eval/treatment. I will speak with him and call them back. She verbalized understanding. ?

## 2021-12-18 NOTE — Telephone Encounter (Signed)
Called back and spoke with Tammy. Advised per Dr. Felecia Shelling they should bring her to ER for evaluation/treatment asap. She verbalized understanding.  ?

## 2021-12-30 ENCOUNTER — Other Ambulatory Visit: Payer: Self-pay | Admitting: Neurology

## 2022-01-04 ENCOUNTER — Telehealth: Payer: Self-pay | Admitting: Neurology

## 2022-01-04 NOTE — Telephone Encounter (Signed)
Called [facility] back and spoke with [place]. Relayed Dr. Garth Bigness message. She verbalized understanding, nothing further needed. ?

## 2022-01-04 NOTE — Telephone Encounter (Signed)
[name] informing there is a drug to drug interaction with GILENYA 0.5 MG CAPS and  NUEDEXTA 20-10 MG capsule  Want confirm nurse or provider is aware; if want to continue the medication. Would like a call from the nurse. ?

## 2022-01-16 ENCOUNTER — Telehealth: Payer: Self-pay | Admitting: Neurology

## 2022-01-16 NOTE — Telephone Encounter (Signed)
Pt fell and broke her hip, would like to come in to be seen for that also, pt had a reaction to medication and went into the hospital-needs a HFU. Asking to review current CT & MRI due to new lesion. ?

## 2022-01-16 NOTE — Telephone Encounter (Signed)
Called the friend back. She states that 3/27 she had went in the hospital at which point they completed CT and MRI and she was under the impression that she needed to have the patient follow up post imaging because they questioned new area. ?She states that the patient confusion and memory is worsening and that she got up this morning and had another fall.  ?I advised that since we had just seen her in march, I am unsure if he would want to bring her in the office. I advised I would bring to his attention these changes that have occurred. I will make him aware about the patient having the imaging completed 3/27 (in care everywhere) Advised that Dr Felecia Shelling will review the images and advise if there is any new areas for concern and whether he has any other recommendations or would like to have her follow up again. Informed I would call her back with his thoughts and recommendations.  ?

## 2022-01-17 ENCOUNTER — Telehealth: Payer: Self-pay | Admitting: *Deleted

## 2022-01-17 DIAGNOSIS — Z0289 Encounter for other administrative examinations: Secondary | ICD-10-CM

## 2022-01-17 NOTE — Addendum Note (Signed)
Addended by: Wyvonnia Lora on: 01/17/2022 02:54 PM ? ? Modules accepted: Orders ? ?

## 2022-01-17 NOTE — Telephone Encounter (Signed)
Gave completed/signed form back to medical records to process for pt. 

## 2022-01-17 NOTE — Telephone Encounter (Signed)
Called Tammy back. Relayed Dr. Garth Bigness message. She verbalized understanding. ? ?She reports pt did fall yesterday morning d/t episode of dizziness, had skin tear on arm. Cleaned/dressed wound. Will keep an eye on this. Recommended daily cleaning/dressing changes. F.u with PCP if she develops any signs of infection. ?She takes meds at 8am and 8pm. Drinking plenty of fluids. Fall happened around 11:30/12pm.  ?She just finished physical therapy. Educated her on importance of moving positions slowly.  Confirmed she uses walker when she gets up to ambulate. Goes to bathroom alone. She peports she was taken off meclizine by hospital. Thought this plus UTI was causing sx. I took off med list. ? ?They will continue to monitor pt. If any new or worsening sx moving forward, they will call back. ?

## 2022-01-18 NOTE — Telephone Encounter (Signed)
Spoke with patient and advised her that her forms are complete. She is going to try to make it in to pick up a copy, but a copy has also been mailed to her. ?

## 2022-02-20 ENCOUNTER — Telehealth: Payer: Self-pay | Admitting: Neurology

## 2022-02-20 MED ORDER — SERTRALINE HCL 100 MG PO TABS: 100.0000 mg | ORAL_TABLET | Freq: Every day | ORAL | 3 refills | Status: DC

## 2022-02-20 NOTE — Telephone Encounter (Signed)
E-scribed refill 

## 2022-02-20 NOTE — Telephone Encounter (Signed)
Pt request refill for sertraline (ZOLOFT) 100 MG tablet at [facility] [facility]

## 2022-03-27 ENCOUNTER — Other Ambulatory Visit: Payer: Self-pay | Admitting: Neurology

## 2022-04-26 ENCOUNTER — Telehealth: Payer: Self-pay | Admitting: Neurology

## 2022-04-26 MED ORDER — BUSPIRONE HCL 15 MG PO TABS
15.0000 mg | ORAL_TABLET | Freq: Two times a day (BID) | ORAL | 1 refills | Status: DC
Start: 2022-04-26 — End: 2022-06-21

## 2022-04-26 NOTE — Telephone Encounter (Signed)
E-scribed refill 

## 2022-04-26 NOTE — Telephone Encounter (Signed)
Pt request refill for busPIRone (BUSPAR) 15 MG tablet at [place] [place] [facility]

## 2022-06-05 ENCOUNTER — Telehealth: Payer: Self-pay | Admitting: *Deleted

## 2022-06-05 NOTE — Telephone Encounter (Signed)
Received fax from [facility] that pt is wanting 7 remaninig teeth pulled and needing clearance for IV sedation (versed, atropine, decadron, fentanyl and sodium Brevital).  Dr. Felecia Shelling approved IV sedation and this was faxed back to them at [phone]. Received fax confirmation.

## 2022-06-11 ENCOUNTER — Other Ambulatory Visit: Payer: Self-pay | Admitting: Neurology

## 2022-06-21 ENCOUNTER — Ambulatory Visit (INDEPENDENT_AMBULATORY_CARE_PROVIDER_SITE_OTHER): Payer: Managed Care, Other (non HMO) | Admitting: Neurology

## 2022-06-21 ENCOUNTER — Telehealth: Payer: Self-pay | Admitting: Neurology

## 2022-06-21 ENCOUNTER — Encounter: Payer: Self-pay | Admitting: Neurology

## 2022-06-21 VITALS — BP 118/74 | HR 72 | Ht 64.0 in | Wt 115.0 lb

## 2022-06-21 DIAGNOSIS — Z79899 Other long term (current) drug therapy: Secondary | ICD-10-CM | POA: Diagnosis not present

## 2022-06-21 DIAGNOSIS — R269 Unspecified abnormalities of gait and mobility: Secondary | ICD-10-CM

## 2022-06-21 DIAGNOSIS — Z8679 Personal history of other diseases of the circulatory system: Secondary | ICD-10-CM | POA: Diagnosis not present

## 2022-06-21 DIAGNOSIS — G35 Multiple sclerosis: Secondary | ICD-10-CM

## 2022-06-21 DIAGNOSIS — F482 Pseudobulbar affect: Secondary | ICD-10-CM

## 2022-06-21 DIAGNOSIS — F09 Unspecified mental disorder due to known physiological condition: Secondary | ICD-10-CM

## 2022-06-21 DIAGNOSIS — F418 Other specified anxiety disorders: Secondary | ICD-10-CM

## 2022-06-21 DIAGNOSIS — R443 Hallucinations, unspecified: Secondary | ICD-10-CM

## 2022-06-21 MED ORDER — LORAZEPAM 0.5 MG PO TABS: 0.5000 mg | ORAL_TABLET | Freq: Every day | ORAL | 5 refills | Status: DC

## 2022-06-21 NOTE — Progress Notes (Signed)
[facility]  PATIENT: Dawn Keith DOB: 04-08-1961  REFERRING DOCTOR OR PCP:  Halford Chessman, PA-C SOURCE: patient  _________________________________   HISTORICAL  CHIEF COMPLAINT:  Chief Complaint  Patient presents with   Follow-up    Pt with friend, Tammy, rm 1 overall stable. The seroquel is not helping much at bedtime they said. DMT fingolimod    HISTORY OF PRESENT ILLNESS:  Dawn Keith is a 62 y.o. woman with MS.   Update 06/21/2022: She is on Gilenya 4 times a week (lymphocytes have been low) She has had more MS progression.   She has fallen a few time but no injuries.   The right leg is weaker than the left.   She has a foot drop there   Earlier this year she brok her hip with a fall   She has a walker or other people for balance.     Last year after a fall gait worsened and MRI showed that she had a subdural hematoma.  It was followed and had a better appearance on CT 07/13/21  She is trembling more in her hands and feet.    Her family is noting she has more trouble with cognitive tasks, decision making, etc.   She has trouble remembering wheat she was planning on doing.   She does better with a hint.  She is on Gilenya and tolerates it well.  The MRIs of the brain and cervical spine performed in 2020 did not show any new lesions.  She has no exacerbations but has had progression.     She has spasticity and weakness in both legs and mild weakness in the arms.    Denies numbness or dysesthesia. Bladder is doing better with oxybutynin.  She has vertigo and feels sick from this with nausea.   She has had constipation and takes Miralax, stool softeners and dulcolax.   With that he has one BM a day but is concerned that she still feels constipated.   Xrays had not show any impaction.     She has paranoia and has not seen psychiatry lately. She is on lamotrigine, buspar, quetiapine and Nuedexta and sertraline.    She has sleep onset and maintenance insomnia.    Seroquel has not helped much.      She has anxiety and depression.  She also has had pseudobulbar affect.  She rarely has delusions now since medications were modified.  She takes buspirone, sertraline and lamotrigine and Nuedexta.    She has decreased cognition.   She also reports fatigue.     She has headaches at times..     Tylenol takes the edge off some.   No N/V.  Only mild photophobia.   No phonophobia.     MS history:   She was diagnosed with MS in 2012 after several years of worsening gait.    She started on Gilenya and has tolerated it well and has had no defiinite exacerbation but has had progressive gait disturbance and cognitive issues.    IMAGING: MRI of the cervical spine 10/09/2018 without contrast shows multiple T2 hyperintense foci within the spinal cord as detailed above.  Compared to the 2012 MRI, none of these appear to be new.  They are consistent with chronic demyelinating plaque associated with multiple sclerosis.  Also has multilevel degenerative changes as detailed above that does not lead to spinal stenosis or nerve root compression.  There has been mild progression of the degenerative changes at C4-C5  and C5-C6.  MRI of the brain 10/09/2018 without contrast shows multiple T2/flair hyperintense foci in the hemispheres, brainstem and spinal cord in a pattern and configuration consistent with chronic demyelinating plaque associated with multiple sclerosis.  None of the foci appears to be acute.     Moderate generalized cortical atrophy.      Compared to the MRI dated 08/20/2011, there are no definite new lesions.  The left middle cerebellar peduncle lesion is smaller on the current study.  The generalized cortical atrophy has slightly progressed.    REVIEW OF SYSTEMS: Constitutional: No fevers, chills, sweats, or change in appetite.  Notes fatigue Eyes: No visual changes, double vision, eye pain Ear, nose and throat: No hearing loss, ear pain, nasal congestion, sore  throat Cardiovascular: No chest pain, palpitations Respiratory:  No shortness of breath at rest or with exertion.   No wheezes.   Pneumonia in 2017 GastrointestinaI: No nausea, vomiting, diarrhea, abdominal pain, fecal incontinence Genitourinary:  as above Musculoskeletal:  No neck pain, back pain Integumentary: No rash, pruritus, skin lesions Neurological: as above Psychiatric: as above Endocrine: No palpitations, diaphoresis, change in appetite, change in weigh or increased thirst Hematologic/Lymphatic:  No anemia, purpura, petechiae. Allergic/Immunologic: No itchy/runny eyes, nasal congestion, recent allergic reactions, rashes  ALLERGIES: Allergies  Allergen Reactions   Donepezil     "makes me mean"   Latex     HOME MEDICATIONS:  Current Outpatient Medications:    acetaminophen (TYLENOL) 500 MG tablet, Take 2 tablets by mouth every morning. Alternates with ibup., Disp: , Rfl:    Cholecalciferol 25 MCG (1000 UT) tablet, Take 1,000 Units by mouth daily., Disp: , Rfl:    Cyanocobalamin (VITAMIN B-12) 2500 MCG TABS, Take 2,500 mcg by mouth daily., Disp: 30 tablet, Rfl: 5   Docusate Calcium (STOOL SOFTENER PO), Take 1 tablet by mouth every morning., Disp: , Rfl:    Fingolimod HCl 0.5 MG CAPS, TAKE 1 CAPSULE DAILY, Disp: 90 capsule, Rfl: 3   lamoTRIgine (LAMICTAL) 150 MG tablet, Take 1 tablet (150 mg total) by mouth 2 (two) times daily. Take 1 tablet by mouth twice daily, Disp: 180 tablet, Rfl: 1   levothyroxine (SYNTHROID) 100 MCG tablet, Take 100 mcg by mouth daily., Disp: , Rfl:    Naproxen Sodium 220 MG CAPS, Take by mouth., Disp: , Rfl:    NUEDEXTA 20-10 MG capsule, Take 1 capsule by mouth twice daily, Disp: 60 capsule, Rfl: 11   oxybutynin (DITROPAN) 5 MG tablet, Take 1 tablet (5 mg total) by mouth 2 (two) times daily., Disp: 180 tablet, Rfl: 3   polyethylene glycol powder (GLYCOLAX/MIRALAX) 17 GM/SCOOP powder, Take 17 g by mouth every morning. 1cap every AM, Disp: , Rfl:     Prenatal MV & Min w/FA-DHA (PRENATAL ADULT GUMMY/DHA/FA) 0.4-25 MG CHEW, Chew 2 each by mouth in the morning., Disp: , Rfl:    QUEtiapine (SEROQUEL) 25 MG tablet, Take 1 tablet (25 mg total) by mouth at bedtime., Disp: 30 tablet, Rfl: 11   sertraline (ZOLOFT) 100 MG tablet, Take 1 tablet (100 mg total) by mouth daily., Disp: 90 tablet, Rfl: 3   VITAMIN E PO, Take 1 capsule by mouth in the morning., Disp: , Rfl:    LORazepam (ATIVAN) 0.5 MG tablet, Take 1 tablet (0.5 mg total) by mouth at bedtime., Disp: 30 tablet, Rfl: 5  PAST MEDICAL HISTORY: Past Medical History:  Diagnosis Date   Depressed    Hyperlipemia    Movement disorder    MS (  multiple sclerosis) ([place])    Vision abnormalities     PAST SURGICAL HISTORY: Past Surgical History:  Procedure Laterality Date   ABDOMINAL HYSTERECTOMY      FAMILY HISTORY: No family history on file.  SOCIAL HISTORY:  Social History   Socioeconomic History   Marital status: Married    Spouse name: Not on file   Number of children: Not on file   Years of education: Not on file   Highest education level: Not on file  Occupational History   Not on file  Tobacco Use   Smoking status: Never   Smokeless tobacco: Never  Substance and Sexual Activity   Alcohol use: No   Drug use: No   Sexual activity: Never    Birth control/protection: Surgical  Other Topics Concern   Not on file  Social History Narrative   Lives with husband, Engineer, technical sales   Social Determinants of Health   Financial Resource Strain: Not on file  Food Insecurity: Not on file  Transportation Needs: Not on file  Physical Activity: Not on file  Stress: Not on file  Social Connections: Not on file  Intimate Partner Violence: Not on file     PHYSICAL EXAM  Vitals:   06/21/22 1458  BP: 118/74  Pulse: 72  Weight: 115 lb (52.2 kg)  Height: '5\' 4"'$  (1.626 m)    Body mass index is 19.74 kg/m.   General: The patient is well-developed and well-nourished and in no acute  distress.     Skin: Extremities are without rash or significant edema.   Neurologic Exam  Mental status: The patient is alert and oriented x 3 at the time of the examination.  She has reduced focus and attention.  She has mildly reduced memory and attention, similar to previous visits.  Speech is normal except some word finding pauses  Cranial nerves: Extraocular movements are full.   Mildly reduced color saturation OS.   Facial strength and sensation is normal.  Trapezius strength is normal   No obvious hearing deficits are noted.  Motor:  Muscle bulk is normal.   Muscle tone is increased in the legs, right greater than left.  Strength is 5/5 in srms and 4+-5/5 left leg and 4+ right leg  Sensory: She had reduced vibration sensation in the legs relative to the arms.  Touch sensation was more normal  Coordination: Finger-nose-finger is performed fairly normally but reduced heel to shin, worse on right.  .   Gait and station: Station is normal.  She is able to walk in the room without support but is off balance and does better holding somebody's hand.  She has a right foot drop.  She is unable to do tandem gait. Romberg is borderline.   Reflexes: Deep tendon reflexes are normal in the arms and increased in the legs.      ASSESSMENT AND PLAN    1. Multiple sclerosis ([place])   2. High risk medication use   3. 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)   4. History of subdural hematoma   5. Abnormal gait   6. Depression with anxiety   7. Pseudobulbar affect   8. Hallucination        1.   She will continue Gilenya for her active SPMS.   Takes qod as lymphocyte count low . Labs were checked 01/2021 so can hold off.       Check MRI brain and cervical spine and consider a more efficacious medication if progression/new lesions  seen.   2.   Continue medications including buspirone, sertraline and lamotrigine and Nuedexta.  Can add lorazepam at bedtime and d/c Seroquel - if  hallucinations worsen 3.   Return in 6 months or sooner if there are new or worsening neurologic symptoms.  40-minute office visit with the majority of the time spent face-to-face for history and physical, discussion/counseling and decision-making.  Additional time with record review and documentation.  Alin Chavira A. Felecia Shelling, MD, PhD 0/37/5436, 0:67 PM Certified in Neurology, Clinical Neurophysiology, Sleep Medicine, Pain Medicine and Neuroimaging  [facility] [address] [place], [place] [address] [phone]

## 2022-06-21 NOTE — Telephone Encounter (Signed)
[employer] sent to [facility] they obtain auth  

## 2022-06-22 LAB — CBC WITH DIFFERENTIAL/PLATELET
Basophils Absolute: 0 10*3/uL (ref 0.0–0.2)
Basos: 0 %
EOS (ABSOLUTE): 0 10*3/uL (ref 0.0–0.4)
Eos: 0 %
Hematocrit: 33.2 % — ABNORMAL LOW (ref 34.0–46.6)
Hemoglobin: 11.3 g/dL (ref 11.1–15.9)
Immature Grans (Abs): 0 10*3/uL (ref 0.0–0.1)
Immature Granulocytes: 0 %
Lymphocytes Absolute: 0.4 10*3/uL — ABNORMAL LOW (ref 0.7–3.1)
Lymphs: 9 %
MCH: 31.7 pg (ref 26.6–33.0)
MCHC: 34 g/dL (ref 31.5–35.7)
MCV: 93 fL (ref 79–97)
Monocytes Absolute: 0.7 10*3/uL (ref 0.1–0.9)
Monocytes: 15 %
Neutrophils Absolute: 3.6 10*3/uL (ref 1.4–7.0)
Neutrophils: 76 %
Platelets: 330 10*3/uL (ref 150–450)
RBC: 3.57 x10E6/uL — ABNORMAL LOW (ref 3.77–5.28)
RDW: 13 % (ref 11.7–15.4)
WBC: 4.8 10*3/uL (ref 3.4–10.8)

## 2022-06-22 LAB — COMPREHENSIVE METABOLIC PANEL
ALT: 10 IU/L (ref 0–32)
AST: 18 IU/L (ref 0–40)
Albumin/Globulin Ratio: 1.9 (ref 1.2–2.2)
Albumin: 4.4 g/dL (ref 3.9–4.9)
Alkaline Phosphatase: 73 IU/L (ref 44–121)
BUN/Creatinine Ratio: 19 (ref 12–28)
BUN: 15 mg/dL (ref 8–27)
Bilirubin Total: <0.2 mg/dL (ref 0.0–1.2)
CO2: 27 mmol/L (ref 20–29)
Calcium: 9.7 mg/dL (ref 8.7–10.3)
Chloride: 101 mmol/L (ref 96–106)
Creatinine, Ser: 0.78 mg/dL (ref 0.57–1.00)
Globulin, Total: 2.3 g/dL (ref 1.5–4.5)
Glucose: 96 mg/dL (ref 70–99)
Potassium: 5 mmol/L (ref 3.5–5.2)
Sodium: 140 mmol/L (ref 134–144)
Total Protein: 6.7 g/dL (ref 6.0–8.5)
eGFR: 85 mL/min/{1.73_m2} (ref >59)

## 2022-06-26 ENCOUNTER — Telehealth: Payer: Self-pay | Admitting: Neurology

## 2022-06-26 NOTE — Telephone Encounter (Signed)
Called pt. Advised she can continue daily multivitamin/prenatal. Ok to take 1000U-2000U daily of Vit D. Can continue other vitamins.  Ok to take stool softener. She verbalized understanding.

## 2022-06-26 NOTE — Telephone Encounter (Signed)
Pt is calling. Stated she would like a nurse to call her about  if she is taking enough vitamins. Pt is requesting a call-back.

## 2022-08-21 ENCOUNTER — Encounter: Payer: Self-pay | Admitting: Neurology

## 2022-09-18 ENCOUNTER — Other Ambulatory Visit: Payer: Self-pay | Admitting: Neurology

## 2022-10-16 ENCOUNTER — Other Ambulatory Visit: Payer: Self-pay | Admitting: Neurology

## 2022-10-18 ENCOUNTER — Other Ambulatory Visit: Payer: Self-pay | Admitting: Neurology

## 2022-10-21 ENCOUNTER — Encounter: Payer: Self-pay | Admitting: Neurology

## 2022-10-23 ENCOUNTER — Other Ambulatory Visit: Payer: Self-pay | Admitting: *Deleted

## 2022-10-23 MED ORDER — BUSPIRONE HCL 15 MG PO TABS: 15.0000 mg | ORAL_TABLET | Freq: Two times a day (BID) | ORAL | 2 refills | Status: DC

## 2022-10-31 ENCOUNTER — Encounter: Payer: Self-pay | Admitting: Neurology

## 2022-11-15 ENCOUNTER — Other Ambulatory Visit: Payer: Self-pay | Admitting: Neurology

## 2022-11-20 ENCOUNTER — Other Ambulatory Visit: Payer: Self-pay | Admitting: Neurology

## 2022-11-25 ENCOUNTER — Other Ambulatory Visit: Payer: Self-pay | Admitting: Neurology

## 2022-11-26 NOTE — Telephone Encounter (Signed)
Last seen in 05/2022, no 6 month follow up scheduled.

## 2022-12-06 ENCOUNTER — Telehealth: Payer: Self-pay | Admitting: *Deleted

## 2022-12-06 NOTE — Telephone Encounter (Signed)
Submitted PA fingolimod on covermymeds. KeyHA:[id number]. Received instant approval: "CaseId:[id number];Status:Approved;Review Type:Prior Auth;Coverage Start Date:11/06/2022;Coverage End Date:12/06/2023;"

## 2022-12-11 ENCOUNTER — Telehealth: Payer: Self-pay | Admitting: Neurology

## 2022-12-11 NOTE — Telephone Encounter (Signed)
Pt's friend Lynelle Smoke called stating that she sent a Mychart message this morning but she would like message to be disregarded. She is fine with the May appt.

## 2022-12-11 NOTE — Telephone Encounter (Signed)
Patient scheduled on 01/30/23.  I don't see that a message was sent via my chart.

## 2022-12-15 ENCOUNTER — Other Ambulatory Visit: Payer: Self-pay | Admitting: Neurology

## 2022-12-16 ENCOUNTER — Other Ambulatory Visit: Payer: Self-pay | Admitting: Neurology

## 2022-12-17 ENCOUNTER — Other Ambulatory Visit: Payer: Self-pay | Admitting: Neurology

## 2022-12-17 NOTE — Telephone Encounter (Signed)
Last seen on 06/21/22 Follow up scheduled on 01/30/23

## 2022-12-17 NOTE — Telephone Encounter (Signed)
Last seen on 06/21/22 per note "Can add lorazepam at bedtime and d/c Seroquel - if hallucinations worsen" It appears pt is taking lorazepam. Follow up visit scheduled on 01/30/23

## 2022-12-23 ENCOUNTER — Other Ambulatory Visit: Payer: Self-pay | Admitting: Neurology

## 2022-12-24 NOTE — Telephone Encounter (Signed)
Last seen on 06/21/22 Follow up scheduled on 01/30/23 

## 2022-12-27 ENCOUNTER — Ambulatory Visit: Payer: Managed Care, Other (non HMO) | Admitting: Neurology

## 2023-01-15 ENCOUNTER — Other Ambulatory Visit: Payer: Self-pay | Admitting: Neurology

## 2023-01-15 NOTE — Telephone Encounter (Signed)
Pt last seen on 06/21/22 per note "2.   Continue medications including buspirone, sertraline and lamotrigine and Nuedexta.  Can add lorazepam at bedtime and d/c Seroquel - if hallucinations worsen   Follow up scheduled on 01/30/23 Last filled on 12/19/22 #60 tablets (30 day supply)

## 2023-01-30 ENCOUNTER — Encounter: Payer: Self-pay | Admitting: Neurology

## 2023-01-30 ENCOUNTER — Telehealth: Payer: Self-pay | Admitting: Neurology

## 2023-01-30 ENCOUNTER — Ambulatory Visit: Payer: Managed Care, Other (non HMO) | Admitting: Neurology

## 2023-01-30 VITALS — BP 129/77 | HR 59 | Ht 64.0 in | Wt 117.6 lb

## 2023-01-30 DIAGNOSIS — R269 Unspecified abnormalities of gait and mobility: Secondary | ICD-10-CM

## 2023-01-30 DIAGNOSIS — G35 Multiple sclerosis: Secondary | ICD-10-CM | POA: Diagnosis not present

## 2023-01-30 DIAGNOSIS — F418 Other specified anxiety disorders: Secondary | ICD-10-CM | POA: Diagnosis not present

## 2023-01-30 DIAGNOSIS — F09 Unspecified mental disorder due to known physiological condition: Secondary | ICD-10-CM

## 2023-01-30 DIAGNOSIS — Z79899 Other long term (current) drug therapy: Secondary | ICD-10-CM | POA: Diagnosis not present

## 2023-01-30 DIAGNOSIS — R443 Hallucinations, unspecified: Secondary | ICD-10-CM

## 2023-01-30 MED ORDER — SERTRALINE HCL 100 MG PO TABS: 100.0000 mg | ORAL_TABLET | Freq: Every day | ORAL | 3 refills | Status: DC

## 2023-01-30 MED ORDER — OLANZAPINE 5 MG PO TABS: 5.0000 mg | ORAL_TABLET | Freq: Every day | ORAL | 5 refills | Status: DC

## 2023-01-30 MED ORDER — BUSPIRONE HCL 15 MG PO TABS: 15.0000 mg | ORAL_TABLET | Freq: Two times a day (BID) | ORAL | 3 refills | Status: DC

## 2023-01-30 MED ORDER — NUEDEXTA 20-10 MG PO CAPS: 1.0000 | ORAL_CAPSULE | Freq: Two times a day (BID) | ORAL | 11 refills | Status: DC

## 2023-01-30 MED ORDER — OXYBUTYNIN CHLORIDE 5 MG PO TABS: 5.0000 mg | ORAL_TABLET | Freq: Two times a day (BID) | ORAL | 0 refills | Status: DC

## 2023-01-30 NOTE — Telephone Encounter (Signed)
[employer] sent to [facility] they obtain auth [id number] 

## 2023-01-30 NOTE — Progress Notes (Signed)
[facility]  PATIENT: Dawn Keith DOB: September 09, 1961  REFERRING DOCTOR OR PCP:  Hayden Rasmussen, PA-C SOURCE: patient  _________________________________   HISTORICAL  CHIEF COMPLAINT:  Chief Complaint  Patient presents with   Follow-up    RM 64 with daughter. Last seen  06/21/22. MS DMT: fingolimod. Noticed swelling in legs/feet in the last few weeks.     HISTORY OF PRESENT ILLNESS:  Dawn Keith is a 62 y.o. woman with MS.   Update 01/30/2023: She is on Gilenya 4 times a week (lymphocytes have been low).  The MRIs of the brain and cervical spine performed in 2020 did not show any new lesions.  MRi in 2022 after new symptoms showed an SDH that looked better on CT a month later.   She has no exacerbations but has had progression.     Gait is reduced and she falls about once a month.    She has spasticity and weakness in both legs and mild weakness in the arms.    Her right side feels weaker than left.   She had one fall last year breaking her hip when right leg didn't rise over a threshold and had an SDH in 2022 after a fall.   Denies numbness or dysesthesia.   She uses a walker.     Bladder is doing better with oxybutynin.  She wears Pull-ups or pads as she has have some accidents.   She has some urinary hesitancy.    She does better with prunes than with Miralax, stool softeners and dulcolax.   She often feels constipated and also feels she is not wiping well..   She takes long showers - sometimes two hours.     She has a walker or other people for balance.     Last year after a fall gait worsened and MRI showed that she had a subdural hematoma.  It was followed and had a better appearance on CT 07/13/21  She is trembling more in her hands and feet.    Her family is noting she has more trouble with cognitive tasks, decision making, etc.   She has trouble remembering wheat she was planning on doing.   She does better with a hint.  She has paranoia and has not seen  psychiatry lately. Her daughter feels it is worse.  She is on lamotrigine, buspar, and Nuedexta and sertraline.    She has sleep onset and maintenance insomnia.   Seroquel did  not helped much.  She started Tylenol PM and is sleeping better    She has anxiety and depression.  She also has had pseudobulbar affect.  She rarely has delusions now since medications were modified.  She takes buspirone, sertraline and lamotrigine and Nuedexta.    She has decreased cognition.   She also reports fatigue.        No N/V.  Only mild photophobia.   No phonophobia.     MS history:   She was diagnosed with MS in 2012 after several years of worsening gait.    She started on Gilenya and has tolerated it well and has had no defiinite exacerbation but has had progressive gait disturbance and cognitive issues.    IMAGING: MRI of the cervical spine 10/09/2018 without contrast shows multiple T2 hyperintense foci within the spinal cord as detailed above.  Compared to the 2012 MRI, none of these appear to be new.  They are consistent with chronic demyelinating plaque associated with multiple sclerosis.  Also has multilevel degenerative changes as detailed above that does not lead to spinal stenosis or nerve root compression.  There has been mild progression of the degenerative changes at C4-C5 and C5-C6.  MRI of the brain 10/09/2018 without contrast shows multiple T2/flair hyperintense foci in the hemispheres, brainstem and spinal cord in a pattern and configuration consistent with chronic demyelinating plaque associated with multiple sclerosis.  None of the foci appears to be acute.     Moderate generalized cortical atrophy.      Compared to the MRI dated 08/20/2011, there are no definite new lesions.  The left middle cerebellar peduncle lesion is smaller on the current study.  The generalized cortical atrophy has slightly progressed.    REVIEW OF SYSTEMS: Constitutional: No fevers, chills, sweats, or change in appetite.   Notes fatigue Eyes: No visual changes, double vision, eye pain Ear, nose and throat: No hearing loss, ear pain, nasal congestion, sore throat Cardiovascular: No chest pain, palpitations Respiratory:  No shortness of breath at rest or with exertion.   No wheezes.   Pneumonia in 2017 GastrointestinaI: No nausea, vomiting, diarrhea, abdominal pain, fecal incontinence Genitourinary:  as above Musculoskeletal:  No neck pain, back pain Integumentary: No rash, pruritus, skin lesions Neurological: as above Psychiatric: as above Endocrine: No palpitations, diaphoresis, change in appetite, change in weigh or increased thirst Hematologic/Lymphatic:  No anemia, purpura, petechiae. Allergic/Immunologic: No itchy/runny eyes, nasal congestion, recent allergic reactions, rashes  ALLERGIES: Allergies  Allergen Reactions   Donepezil     "makes me mean"   Latex     HOME MEDICATIONS:  Current Outpatient Medications:    acetaminophen (TYLENOL) 500 MG tablet, Take 2 tablets by mouth every morning. Alternates with ibup.  Gives 4 tylenol and 4 ibuprofen as needed, Disp: , Rfl:    Cholecalciferol 25 MCG (1000 UT) tablet, Take 1,000 Units by mouth daily., Disp: , Rfl:    Cyanocobalamin (VITAMIN B-12) 2500 MCG TABS, Take 2,500 mcg by mouth daily., Disp: 30 tablet, Rfl: 5   diphenhydramine-acetaminophen (TYLENOL PM) 25-500 MG TABS tablet, Take 1 tablet by mouth at bedtime., Disp: , Rfl:    Fingolimod HCl 0.5 MG CAPS, TAKE 1 CAPSULE DAILY (Patient taking differently: Take 1 capsule by mouth every other day.), Disp: 90 capsule, Rfl: 3   lamoTRIgine (LAMICTAL) 150 MG tablet, Take 1 tablet by mouth twice daily, Disp: 180 tablet, Rfl: 0   levothyroxine (SYNTHROID) 100 MCG tablet, Take 100 mcg by mouth daily., Disp: , Rfl:    Multiple Vitamins-Minerals (HAIR SKIN & NAILS PO), Take 1 tablet by mouth daily., Disp: , Rfl:    OLANZapine (ZYPREXA) 5 MG tablet, Take 1 tablet (5 mg total) by mouth at bedtime., Disp: 30  tablet, Rfl: 5   UNABLE TO FIND, Med Name: Eats 4 prunes per day to help keep BM regular, Disp: , Rfl:    VITAMIN E PO, Take 1 capsule by mouth in the morning., Disp: , Rfl:    busPIRone (BUSPAR) 15 MG tablet, Take 1 tablet (15 mg total) by mouth 2 (two) times daily., Disp: 180 tablet, Rfl: 3   NUEDEXTA 20-10 MG capsule, Take 1 capsule by mouth 2 (two) times daily., Disp: 60 capsule, Rfl: 11   oxybutynin (DITROPAN) 5 MG tablet, Take 1 tablet (5 mg total) by mouth 2 (two) times daily., Disp: 180 tablet, Rfl: 0   sertraline (ZOLOFT) 100 MG tablet, Take 1 tablet (100 mg total) by mouth daily., Disp: 90 tablet, Rfl: 3  PAST MEDICAL  HISTORY: Past Medical History:  Diagnosis Date   Depressed    Hyperlipemia    Movement disorder    MS (multiple sclerosis) (HCC)    Vision abnormalities     PAST SURGICAL HISTORY: Past Surgical History:  Procedure Laterality Date   ABDOMINAL HYSTERECTOMY      FAMILY HISTORY: No family history on file.  SOCIAL HISTORY:  Social History   Socioeconomic History   Marital status: Married    Spouse name: Not on file   Number of children: Not on file   Years of education: Not on file   Highest education level: Not on file  Occupational History   Not on file  Tobacco Use   Smoking status: Never   Smokeless tobacco: Never  Substance and Sexual Activity   Alcohol use: No   Drug use: No   Sexual activity: Never    Birth control/protection: Surgical  Other Topics Concern   Not on file  Social History Narrative   Lives with husband, Optometrist   Social Determinants of Health   Financial Resource Strain: Not on file  Food Insecurity: Not on file  Transportation Needs: Not on file  Physical Activity: Not on file  Stress: Not on file  Social Connections: Not on file  Intimate Partner Violence: Not on file     PHYSICAL EXAM  Vitals:   01/30/23 1407  BP: 129/77  Pulse: (!) 59  Weight: 117 lb 9.6 oz (53.3 kg)  Height: 5\' 4"  (1.626 m)    Body  mass index is 20.19 kg/m.   General: The patient is well-developed and well-nourished and in no acute distress.     Skin: Extremities are without rash or significant edema.   Neurologic Exam  Mental status: The patient is alert and oriented x 2 at the time of the examination.  She has reduced focus and attention.  She has mildly reduced memory and attention, similar to previous visits.  Speech is normal except some word finding pauses  Cranial nerves: Extraocular movements are full.   Mildly reduced color saturation OS.   Facial strength and sensation is normal.  Trapezius strength is normal   No obvious hearing deficits are noted.  Motor:  Muscle bulk is normal.   Muscle tone is increased in the legs, right greater than left.  Strength is 5/5 in srms and 4+-5/5 left leg and 4+ right leg  Sensory: She had reduced vibration sensation in the legs relative to the arms.  Touch sensation was more normal  Coordination: Finger-nose-finger is performed fairly normally but reduced heel to shin, worse on right.  .   Gait and station: Station is unbalanced but she can stand without support.  She is able to take a few steps in the room without support  She has a right foot drop.  She is unable to do tandem gait. Romberg is borderline.   Reflexes: Deep tendon reflexes are normal in the arms and increased in the legs.      ASSESSMENT AND PLAN    1. Cognitive deficit secondary to multiple sclerosis (HCC)   2. High risk medication use   3. Abnormal gait   4. Depression with anxiety   5. Hallucination         1.   She will continue Gilenya every other day for her active SPMS.  Check labs.  Check MRI brain and cervical spine and consider a more efficacious medication if progression/new lesions seen.   2.   For hallucinations,  I will add Zyprexa.  She should take 1 a day but can take an additional pill if she has one of her episodes of more agitation.  Continue other medications including  buspirone, sertraline and lamotrigine and Nuedexta.  She discontinued the Seroquel at night and started Tylenol PM.  She feels it has helped her more.   3.   Continue prunes for bowel regimen.  This seems to have worked better the medication.   4.   Return in 6 months or sooner if there are new or worsening neurologic symptoms.   40-minute office visit with the majority of the time spent face-to-face for history and physical, discussion/counseling and decision-making.  Additional time with record review and documentation.  This visit is part of a comprehensive longitudinal care medical relationship regarding the patients primary diagnosis of MS and related concerns.   Rafferty Postlewait A. Epimenio Foot, MD, PhD 01/30/2023, 8:59 PM Certified in Neurology, Clinical Neurophysiology, Sleep Medicine, Pain Medicine and Neuroimaging  [facility] [address] [place], [place] [address] [phone]

## 2023-01-31 ENCOUNTER — Telehealth: Payer: Self-pay | Admitting: *Deleted

## 2023-01-31 LAB — COMPREHENSIVE METABOLIC PANEL
ALT: 29 IU/L (ref 0–32)
AST: 27 IU/L (ref 0–40)
Albumin/Globulin Ratio: 2 (ref 1.2–2.2)
Albumin: 4.5 g/dL (ref 3.9–4.9)
Alkaline Phosphatase: 69 IU/L (ref 44–121)
BUN/Creatinine Ratio: 32 — ABNORMAL HIGH (ref 12–28)
BUN: 23 mg/dL (ref 8–27)
Bilirubin Total: <0.2 mg/dL (ref 0.0–1.2)
CO2: 26 mmol/L (ref 20–29)
Calcium: 9.4 mg/dL (ref 8.7–10.3)
Chloride: 98 mmol/L (ref 96–106)
Creatinine, Ser: 0.72 mg/dL (ref 0.57–1.00)
Globulin, Total: 2.3 g/dL (ref 1.5–4.5)
Glucose: 76 mg/dL (ref 70–99)
Potassium: 4.4 mmol/L (ref 3.5–5.2)
Sodium: 139 mmol/L (ref 134–144)
Total Protein: 6.8 g/dL (ref 6.0–8.5)
eGFR: 93 mL/min/{1.73_m2} (ref >59)

## 2023-01-31 LAB — CBC WITH DIFFERENTIAL/PLATELET
Basophils Absolute: 0 10*3/uL (ref 0.0–0.2)
Basos: 1 %
EOS (ABSOLUTE): 0 10*3/uL (ref 0.0–0.4)
Eos: 1 %
Hematocrit: 37.4 % (ref 34.0–46.6)
Hemoglobin: 12.2 g/dL (ref 11.1–15.9)
Immature Grans (Abs): 0 10*3/uL (ref 0.0–0.1)
Immature Granulocytes: 1 %
Lymphocytes Absolute: 0.4 10*3/uL — ABNORMAL LOW (ref 0.7–3.1)
Lymphs: 8 %
MCH: 31.1 pg (ref 26.6–33.0)
MCHC: 32.6 g/dL (ref 31.5–35.7)
MCV: 95 fL (ref 79–97)
Monocytes Absolute: 0.7 10*3/uL (ref 0.1–0.9)
Monocytes: 13 %
Neutrophils Absolute: 4.3 10*3/uL (ref 1.4–7.0)
Neutrophils: 76 %
Platelets: 366 10*3/uL (ref 150–450)
RBC: 3.92 x10E6/uL (ref 3.77–5.28)
RDW: 12.7 % (ref 11.7–15.4)
WBC: 5.5 10*3/uL (ref 3.4–10.8)

## 2023-01-31 NOTE — Telephone Encounter (Signed)
Gave completed/signed form back to medical records to process for pt. 

## 2023-02-07 ENCOUNTER — Encounter: Payer: Self-pay | Admitting: Neurology

## 2023-02-15 ENCOUNTER — Other Ambulatory Visit: Payer: Self-pay | Admitting: Neurology

## 2023-02-19 NOTE — Telephone Encounter (Signed)
Pt last seen on 01/30/23 per note "Continue other medications including buspirone, sertraline and lamotrigine and Nuedexta. " Follow up scheduled on 08/08/23 Last filled on 01/15/22 #60 tablets (30 day supply)

## 2023-02-23 ENCOUNTER — Other Ambulatory Visit: Payer: Self-pay | Admitting: Neurology

## 2023-02-26 NOTE — Telephone Encounter (Signed)
Last seen on 01/30/23 per note "Continue other medications including buspirone, sertraline and lamotrigine and Nuedexta. " Follow up scheduled on 08/08/23

## 2023-03-13 ENCOUNTER — Ambulatory Visit
Admission: RE | Admit: 2023-03-13 | Discharge: 2023-03-13 | Disposition: A | Discharge status: Home / Self Care | Payer: 59 | Source: Ambulatory Visit | Attending: Neurology | Admitting: Neurology

## 2023-03-13 DIAGNOSIS — Z79899 Other long term (current) drug therapy: Secondary | ICD-10-CM

## 2023-03-13 DIAGNOSIS — G35 Multiple sclerosis: Secondary | ICD-10-CM | POA: Diagnosis not present

## 2023-03-13 DIAGNOSIS — F09 Unspecified mental disorder due to known physiological condition: Secondary | ICD-10-CM

## 2023-03-15 ENCOUNTER — Other Ambulatory Visit: Payer: Self-pay | Admitting: Neurology

## 2023-03-18 NOTE — Telephone Encounter (Signed)
Rx sent 

## 2023-05-18 ENCOUNTER — Other Ambulatory Visit: Payer: Self-pay | Admitting: Neurology

## 2023-06-17 ENCOUNTER — Other Ambulatory Visit: Payer: Self-pay | Admitting: Neurology

## 2023-06-18 NOTE — Telephone Encounter (Signed)
This pt is taking 1 capsule every other day.   Has appt 08-08-2023.

## 2023-07-22 ENCOUNTER — Other Ambulatory Visit: Payer: Self-pay | Admitting: Neurology

## 2023-07-22 NOTE — Telephone Encounter (Signed)
Last seen on 01/30/23 Follow up scheduled 08/08/23

## 2023-07-26 ENCOUNTER — Other Ambulatory Visit: Payer: Self-pay | Admitting: Neurology

## 2023-08-08 ENCOUNTER — Encounter: Payer: Self-pay | Admitting: Neurology

## 2023-08-08 ENCOUNTER — Ambulatory Visit: Payer: Managed Care, Other (non HMO) | Admitting: Neurology

## 2023-08-08 VITALS — BP 116/70 | HR 69

## 2023-08-08 DIAGNOSIS — R443 Hallucinations, unspecified: Secondary | ICD-10-CM | POA: Diagnosis not present

## 2023-08-08 DIAGNOSIS — G35 Multiple sclerosis: Secondary | ICD-10-CM | POA: Diagnosis not present

## 2023-08-08 DIAGNOSIS — Z8679 Personal history of other diseases of the circulatory system: Secondary | ICD-10-CM

## 2023-08-08 DIAGNOSIS — R269 Unspecified abnormalities of gait and mobility: Secondary | ICD-10-CM

## 2023-08-08 DIAGNOSIS — F09 Unspecified mental disorder due to known physiological condition: Secondary | ICD-10-CM

## 2023-08-08 DIAGNOSIS — Z79899 Other long term (current) drug therapy: Secondary | ICD-10-CM

## 2023-08-08 NOTE — Progress Notes (Signed)
[facility]  PATIENT: Dawn Keith DOB: 09-18-1961  REFERRING DOCTOR OR PCP:  Hayden Rasmussen, PA-C SOURCE: patient  _________________________________   HISTORICAL  CHIEF COMPLAINT:  Chief Complaint  Patient presents with   Room 11    Pt is here with her Friend. Pt states that she has been doing well since her last appointment.     HISTORY OF PRESENT ILLNESS:  Dawn Keith is a 62 y.o. woman with MS.   Update 11/142024: She is on Gilenya 4 times a week (lymphocytes have been low).  The MRIs of the brain and cervical spine performed in 2020 did not show any new lesions.  MRi in 2022 after new symptoms showed an SDH that looked better on CT a month later.   She has no exacerbations but has had progression.     Gait is poor and she was falling a few times a week, usually when no one was in the room.   She broke her femur wth one fall.  The family now just has he walk a little when someone is in the room    She has spasticity and weakness in both legs and mild weakness in the arms.    Her right side feels weaker than left.    Denies numbness or dysesthesia.   She uses a walker.   She denies numbness.  Bladder is doing better with oxybutynin.  She wears Pull-ups or pads and has occasional incontinence.   She has some urinary hesitancy.  She needs to strain some times.   No recent UTI.    She does better with prunes than with Miralax, stool softeners and dulcolax.  She has occasioanl very large BM's   She has hemorrhoids.  Her diet fluctuates a lot.       She has stable cognitive disorder with reduced memory, focus, judgement, executive function.   She has some paranoia, better than a few years ago.   She has not seen psychiatry lately. Her daughter feels it is worse.  She is on lamotrigine, buspar, and Nuedexta and sertraline.    She has sleep onset and maintenance insomnia.  Olanzepine has haelped paranoia but not sleep.   Seroquel had not helped.   She started Tylenol  PM and is sleeping better    She has anxiety and depression and pseudobulbar affect.  She rarely has delusions now since medications were modified.  For anxiety ad PBA.   She takes buspirone, sertraline and lamotrigine and Nuedexta.    She also reports fatigue.   She has some insomnia and roams some at night.     She leaves TV on all night and often is up late.   She sometimes takes naps.     She has some HA.   No N/V.  Only mild photophobia.   No phonophobia.    Her friend tried to keep her active and they went to a farm that has handicap horses and she rode one hour.      MS history:   She was diagnosed with MS in 2012 after several years of worsening gait.    She started on Gilenya and has tolerated it well and has had no defiinite exacerbation but has had progressive gait disturbance and cognitive issues.    IMAGING: MRI of the cervical spine 10/09/2018 without contrast shows multiple T2 hyperintense foci within the spinal cord as detailed above.  Compared to the 2012 MRI, none of these appear to be new.  They are consistent with chronic demyelinating plaque associated with multiple sclerosis.  Also has multilevel degenerative changes as detailed above that does not lead to spinal stenosis or nerve root compression.  There has been mild progression of the degenerative changes at C4-C5 and C5-C6.  MRI of the brain 10/09/2018 without contrast shows multiple T2/flair hyperintense foci in the hemispheres, brainstem and spinal cord in a pattern and configuration consistent with chronic demyelinating plaque associated with multiple sclerosis.  None of the foci appears to be acute.     Moderate generalized cortical atrophy.      Compared to the MRI dated 08/20/2011, there are no definite new lesions.  The left middle cerebellar peduncle lesion is smaller on the current study.  The generalized cortical atrophy has slightly progressed.  MRI brain 03/13/2023 showed Multiple T2/FLAIR hyperintense foci in the  cerebellum in a pattern consistent with chronic demyelinating plaque associated with multiple sclerosis.  There does not appear to be any new lesion.  No definite change compared to the MRI from 12/18/2021.   Hygroma overlying the right frontal convexity with a maximal diameter of 7 mm, stable compared to the previous MRI.    REVIEW OF SYSTEMS: Constitutional: No fevers, chills, sweats, or change in appetite.  Notes fatigue Eyes: No visual changes, double vision, eye pain Ear, nose and throat: No hearing loss, ear pain, nasal congestion, sore throat Cardiovascular: No chest pain, palpitations Respiratory:  No shortness of breath at rest or with exertion.   No wheezes.   Pneumonia in 2017 GastrointestinaI: No nausea, vomiting, diarrhea, abdominal pain, fecal incontinence Genitourinary:  as above Musculoskeletal:  No neck pain, back pain Integumentary: No rash, pruritus, skin lesions Neurological: as above Psychiatric: as above Endocrine: No palpitations, diaphoresis, change in appetite, change in weigh or increased thirst Hematologic/Lymphatic:  No anemia, purpura, petechiae. Allergic/Immunologic: No itchy/runny eyes, nasal congestion, recent allergic reactions, rashes  ALLERGIES: Allergies  Allergen Reactions   Donepezil     "makes me mean"   Latex     HOME MEDICATIONS:  Current Outpatient Medications:    acetaminophen (TYLENOL) 500 MG tablet, Take 2 tablets by mouth every morning. Alternates with ibup.  Gives 4 tylenol and 4 ibuprofen as needed, Disp: , Rfl:    busPIRone (BUSPAR) 15 MG tablet, Take 1 tablet by mouth twice daily, Disp: 60 tablet, Rfl: 4   Cholecalciferol 25 MCG (1000 UT) tablet, Take 1,000 Units by mouth daily., Disp: , Rfl:    Cyanocobalamin (VITAMIN B-12) 2500 MCG TABS, Take 2,500 mcg by mouth daily., Disp: 30 tablet, Rfl: 5   diphenhydramine-acetaminophen (TYLENOL PM) 25-500 MG TABS tablet, Take 1 tablet by mouth at bedtime., Disp: , Rfl:    Fingolimod HCl 0.5 MG  CAPS, Take 1 capsule (0.5 mg total) by mouth every other day., Disp: 45 capsule, Rfl: 4   lamoTRIgine (LAMICTAL) 150 MG tablet, Take 1 tablet by mouth twice daily, Disp: 180 tablet, Rfl: 0   levothyroxine (SYNTHROID) 100 MCG tablet, Take 100 mcg by mouth daily., Disp: , Rfl:    Multiple Vitamins-Minerals (HAIR SKIN & NAILS PO), Take 1 tablet by mouth daily., Disp: , Rfl:    NUEDEXTA 20-10 MG capsule, Take 1 capsule by mouth twice daily, Disp: 60 capsule, Rfl: 5   OLANZapine (ZYPREXA) 5 MG tablet, TAKE 1 TABLET(5 MG) BY MOUTH AT BEDTIME, Disp: 30 tablet, Rfl: 0   oxybutynin (DITROPAN) 5 MG tablet, TAKE 1 TABLET(5 MG) BY MOUTH TWICE DAILY, Disp: 180 tablet, Rfl: 0  sertraline (ZOLOFT) 100 MG tablet, Take 1 tablet (100 mg total) by mouth daily., Disp: 90 tablet, Rfl: 3   UNABLE TO FIND, Med Name: Eats 4 prunes per day to help keep BM regular, Disp: , Rfl:    VITAMIN E PO, Take 1 capsule by mouth in the morning., Disp: , Rfl:   PAST MEDICAL HISTORY: Past Medical History:  Diagnosis Date   Depressed    Hyperlipemia    Movement disorder    MS (multiple sclerosis) (HCC)    Vision abnormalities     PAST SURGICAL HISTORY: Past Surgical History:  Procedure Laterality Date   ABDOMINAL HYSTERECTOMY     TOTAL HIP ARTHROPLASTY Right 11/05/2021    FAMILY HISTORY: History reviewed. No pertinent family history.  SOCIAL HISTORY:  Social History   Socioeconomic History   Marital status: Married    Spouse name: Not on file   Number of children: Not on file   Years of education: Not on file   Highest education level: Not on file  Occupational History   Not on file  Tobacco Use   Smoking status: Never   Smokeless tobacco: Never  Substance and Sexual Activity   Alcohol use: No   Drug use: No   Sexual activity: Never    Birth control/protection: Surgical  Other Topics Concern   Not on file  Social History Narrative   Lives with husband, Optometrist   Social Determinants of Health    Financial Resource Strain: Not on file  Food Insecurity: Not on file  Transportation Needs: Not on file  Physical Activity: Not on file  Stress: Not on file  Social Connections: Not on file  Intimate Partner Violence: Not on file     PHYSICAL EXAM  Vitals:   08/08/23 1445  BP: 116/70  Pulse: 69    There is no height or weight on file to calculate BMI.   General: The patient is well-developed and well-nourished and in no acute distress.     Skin: Extremities are without rash or significant edema.   Neurologic Exam  Mental status: The patient is alert and oriented x 2 at the time of the examination.  She has reduced focus and attention.  She has mildly reduced memory and attention, similar to previous visits.  Speech is normal except some word finding pauses  Cranial nerves: Extraocular movements are full.   Mildly reduced color saturation OS.   Facial strength and sensation is normal.  Trapezius strength is normal   No obvious hearing deficits are noted.  Motor:  Muscle bulk is normal.   Muscle tone is increased in the legs, right greater than left.  Strength is 5/5 in srms and 4+-5/5 left leg and 4+ right leg  Sensory: She had reduced vibration sensation in the legs relative to the arms and worse in right leg.  Touch sensation was more normal  Coordination: Finger-nose-finger is performed fairly normally but reduced heel to shin, worse on right.  .   Gait and station: Station is unbalanced but she can stand without support.  She can just take a couple steps without support .Marland Kitchen   She has a right foot drop.  She is unable to do tandem gait. Romberg is positive.   Reflexes: Deep tendon reflexes are normal in the arms and increased in the legs.      ASSESSMENT AND PLAN    1. Multiple sclerosis (HCC)   2. High risk medication use   3. Cognitive deficit secondary to  multiple sclerosis (HCC)   4. Abnormal gait   5. Hallucination   6. History of subdural hematoma       1.   She will continue Gilenya every other day for her active SPMS.  Check labs and modify further if 0.2 or lower.    2.   For hallucinations, continue Zyprexa.  She should take 1 a day but can take an additional pill if she has one of her episodes of more agitation.  Continue other medications including buspirone, sertraline and lamotrigine and Nuedexta.  She discontinued the Seroquel at night and started Tylenol PM.  She feels it has helped her more.   3.   Continue prunes for bowel regimen.  This seems to have worked better the medication.   4.   Return in 6 months or sooner if there are new or worsening neurologic symptoms.    This visit is part of a comprehensive longitudinal care medical relationship regarding the patients primary diagnosis of MS and related concerns.   Darel Ricketts A. Epimenio Foot, MD, PhD 08/08/2023, 4:00 PM Certified in Neurology, Clinical Neurophysiology, Sleep Medicine, Pain Medicine and Neuroimaging  [facility] [address] [place], [place] [address] [phone]

## 2023-08-09 ENCOUNTER — Other Ambulatory Visit: Payer: Self-pay | Admitting: Neurology

## 2023-08-09 DIAGNOSIS — G35 Multiple sclerosis: Secondary | ICD-10-CM

## 2023-08-09 DIAGNOSIS — Z79899 Other long term (current) drug therapy: Secondary | ICD-10-CM

## 2023-08-09 LAB — COMPREHENSIVE METABOLIC PANEL
ALT: 46 [IU]/L — ABNORMAL HIGH (ref 0–32)
AST: 46 [IU]/L — ABNORMAL HIGH (ref 0–40)
Albumin: 5 g/dL — ABNORMAL HIGH (ref 3.9–4.9)
Alkaline Phosphatase: 83 [IU]/L (ref 44–121)
BUN/Creatinine Ratio: 30 — ABNORMAL HIGH (ref 12–28)
BUN: 28 mg/dL — ABNORMAL HIGH (ref 8–27)
Bilirubin Total: 0.2 mg/dL (ref 0.0–1.2)
CO2: 23 mmol/L (ref 20–29)
Calcium: 9.9 mg/dL (ref 8.7–10.3)
Chloride: 98 mmol/L (ref 96–106)
Creatinine, Ser: 0.92 mg/dL (ref 0.57–1.00)
Globulin, Total: 2.4 g/dL (ref 1.5–4.5)
Glucose: 78 mg/dL (ref 70–99)
Potassium: 4.8 mmol/L (ref 3.5–5.2)
Sodium: 137 mmol/L (ref 134–144)
Total Protein: 7.4 g/dL (ref 6.0–8.5)
eGFR: 70 mL/min/{1.73_m2} (ref >59)

## 2023-08-09 LAB — CBC WITH DIFFERENTIAL/PLATELET
Basophils Absolute: 0 10*3/uL (ref 0.0–0.2)
Basos: 0 %
EOS (ABSOLUTE): 0 10*3/uL (ref 0.0–0.4)
Eos: 0 %
Hematocrit: 38.5 % (ref 34.0–46.6)
Hemoglobin: 12.7 g/dL (ref 11.1–15.9)
Immature Grans (Abs): 0.1 10*3/uL (ref 0.0–0.1)
Immature Granulocytes: 1 %
Lymphocytes Absolute: 0.3 10*3/uL — ABNORMAL LOW (ref 0.7–3.1)
Lymphs: 4 %
MCH: 31.4 pg (ref 26.6–33.0)
MCHC: 33 g/dL (ref 31.5–35.7)
MCV: 95 fL (ref 79–97)
Monocytes Absolute: 0.6 10*3/uL (ref 0.1–0.9)
Monocytes: 9 %
Neutrophils Absolute: 6.5 10*3/uL (ref 1.4–7.0)
Neutrophils: 86 %
Platelets: 364 10*3/uL (ref 150–450)
RBC: 4.04 x10E6/uL (ref 3.77–5.28)
RDW: 12.6 % (ref 11.7–15.4)
WBC: 7.5 10*3/uL (ref 3.4–10.8)

## 2023-08-27 ENCOUNTER — Other Ambulatory Visit: Payer: Self-pay | Admitting: Neurology

## 2023-08-28 NOTE — Telephone Encounter (Signed)
Last seen on 08/08/23 Follow up scheduled on 02/20/24

## 2023-08-29 ENCOUNTER — Other Ambulatory Visit: Payer: Self-pay | Admitting: Neurology

## 2023-08-29 NOTE — Telephone Encounter (Signed)
Last seen on 08/08/23 Follow up scheduled on 02/20/23

## 2023-12-07 ENCOUNTER — Other Ambulatory Visit: Payer: Self-pay | Admitting: Neurology

## 2024-02-03 ENCOUNTER — Other Ambulatory Visit: Payer: Self-pay | Admitting: Neurology

## 2024-02-12 ENCOUNTER — Encounter: Payer: Self-pay | Admitting: Neurology

## 2024-02-20 ENCOUNTER — Ambulatory Visit (INDEPENDENT_AMBULATORY_CARE_PROVIDER_SITE_OTHER): Payer: Managed Care, Other (non HMO) | Admitting: Neurology

## 2024-02-20 ENCOUNTER — Encounter: Payer: Self-pay | Admitting: Neurology

## 2024-02-20 VITALS — BP 116/78 | HR 65 | Ht 65.0 in | Wt 131.5 lb

## 2024-02-20 DIAGNOSIS — F482 Pseudobulbar affect: Secondary | ICD-10-CM

## 2024-02-20 DIAGNOSIS — Z8679 Personal history of other diseases of the circulatory system: Secondary | ICD-10-CM

## 2024-02-20 DIAGNOSIS — G35 Multiple sclerosis: Secondary | ICD-10-CM

## 2024-02-20 DIAGNOSIS — Z79899 Other long term (current) drug therapy: Secondary | ICD-10-CM | POA: Diagnosis not present

## 2024-02-20 DIAGNOSIS — F09 Unspecified mental disorder due to known physiological condition: Secondary | ICD-10-CM

## 2024-02-20 DIAGNOSIS — R443 Hallucinations, unspecified: Secondary | ICD-10-CM | POA: Diagnosis not present

## 2024-02-20 DIAGNOSIS — R269 Unspecified abnormalities of gait and mobility: Secondary | ICD-10-CM | POA: Diagnosis not present

## 2024-02-20 DIAGNOSIS — F418 Other specified anxiety disorders: Secondary | ICD-10-CM

## 2024-02-20 MED ORDER — NUEDEXTA 20-10 MG PO CAPS: 1.0000 | ORAL_CAPSULE | Freq: Two times a day (BID) | ORAL | 3 refills | Status: DC

## 2024-02-20 MED ORDER — FINGOLIMOD HCL 0.5 MG PO CAPS: 1.0000 | ORAL_CAPSULE | ORAL | 4 refills | Status: DC

## 2024-02-20 MED ORDER — LAMOTRIGINE 150 MG PO TABS: 150.0000 mg | ORAL_TABLET | Freq: Two times a day (BID) | ORAL | 3 refills | Status: AC

## 2024-02-20 MED ORDER — OLANZAPINE 5 MG PO TABS: ORAL_TABLET | ORAL | 4 refills | Status: DC

## 2024-02-20 NOTE — Progress Notes (Signed)
 [facility]  PATIENT: Dawn Keith DOB: Oct 11, 1960  REFERRING DOCTOR OR PCP:  Eliza Gull, PA-C SOURCE: patient  _________________________________   HISTORICAL  CHIEF COMPLAINT:  Chief Complaint  Patient presents with   EMGRM2/MS    Pt is here with her Friend. Pt states things have been terrible with her MS since her appointment. Pt states that everything sucks! Pt last fall was April,February, January of this year.     HISTORY OF PRESENT ILLNESS:  Dawn Keith is a 63 y.o. woman with MS.   Update 02/20/2024: She is on Gilenya  4 times a week (lymphocytes have been low last time checked in November 2024 was0.3).  The MRIs of the brain and cervical spine performed in 02/2023 was stable compared to 2022.  Images were reviewed..   Clinically, she has no exacerbations but has had progression.   She   She feels she is stable.   Her friend notes she constantly picks at her nose skin (has some and has other obsessive traits.    Last visit, an occipital nerve block helped a severe HA.     Gait is reduced and she falls about once a month.    She has spasticity and weakness in both legs and mild weakness in the arms.   Also has ataxia.  She has a walker or other people for balance.    Her right side feels weaker than left.   She had an SDH in 2022 after a fall.   Denies numbness or dysesthesia.   She uses a walker.     Bladder is doing better with oxybutynin .  She still has occasional incontinence and wears Pull-ups or pads as she has have some accidents.   She has some urinary hesitancy.      She often feels constipated and also feels she is not wiping well.. Better with Linzess.    She no longer has paranoia and has not seen psychiatry lately. Her daughter feels it is worse.  She is on lamotrigine , buspar ,,Zyprexa  and Nuedexta  and sertraline .    She has sleep onset and maintenance insomnia.    She has anxiety and depression.  She also has had pseudobulbar affect.  She  rarely has delusions now since medications were modified.  She has decreased cognition.   She also reports fatigue.       MS history:   She was diagnosed with MS in 2012 after several years of worsening gait.    She started on Gilenya  and has tolerated it well and has had no defiinite exacerbation but has had progressive gait disturbance and cognitive issues.    IMAGING: MRI of the cervical spine 10/09/2018 without contrast shows multiple T2 hyperintense foci within the spinal cord as detailed above.  Compared to the 2012 MRI, none of these appear to be new.  They are consistent with chronic demyelinating plaque associated with multiple sclerosis.  Also has multilevel degenerative changes as detailed above that does not lead to spinal stenosis or nerve root compression.  There has been mild progression of the degenerative changes at C4-C5 and C5-C6.  MRI of the brain 10/09/2018 without contrast shows multiple T2/flair hyperintense foci in the hemispheres, brainstem and spinal cord in a pattern and configuration consistent with chronic demyelinating plaque associated with multiple sclerosis.  None of the foci appears to be acute.     Moderate generalized cortical atrophy.      Compared to the MRI dated 08/20/2011, there are no definite new  lesions.  The left middle cerebellar peduncle lesion is smaller on the current study.  The generalized cortical atrophy has slightly progressed.  MRI brain 03/13/2023 showed Multiple T2/FLAIR hyperintense foci in the cerebellum in a pattern consistent with chronic demyelinating plaque associated with multiple sclerosis.  There does not appear to be any new lesion.  No definite change compared to the MRI from 12/18/2021.  Hygroma overlying the right frontal convexity with a maximal diameter of 7 mm, stable compared to the previous MRI.   Generalized cortical atrophy.    REVIEW OF SYSTEMS: Constitutional: No fevers, chills, sweats, or change in appetite.  Notes  fatigue Eyes: No visual changes, double vision, eye pain Ear, nose and throat: No hearing loss, ear pain, nasal congestion, sore throat Cardiovascular: No chest pain, palpitations Respiratory:  No shortness of breath at rest or with exertion.   No wheezes.   Pneumonia in 2017 GastrointestinaI: No nausea, vomiting, diarrhea, abdominal pain, fecal incontinence Genitourinary:  as above Musculoskeletal:  No neck pain, back pain Integumentary: No rash, pruritus, skin lesions Neurological: as above Psychiatric: as above Endocrine: No palpitations, diaphoresis, change in appetite, change in weigh or increased thirst Hematologic/Lymphatic:  No anemia, purpura, petechiae. Allergic/Immunologic: No itchy/runny eyes, nasal congestion, recent allergic reactions, rashes  ALLERGIES: Allergies  Allergen Reactions   Donepezil     "makes me mean"   Latex     HOME MEDICATIONS:  Current Outpatient Medications:    acetaminophen  (TYLENOL ) 500 MG tablet, Take 2 tablets by mouth every morning. Alternates with ibup.  Gives 4 tylenol  and 4 ibuprofen as needed, Disp: , Rfl:    busPIRone  (BUSPAR ) 15 MG tablet, TAKE 1 TABLET(15 MG) BY MOUTH TWICE DAILY, Disp: 180 tablet, Rfl: 3   Cyanocobalamin (VITAMIN B-12) 2500 MCG TABS, Take 2,500 mcg by mouth daily., Disp: 30 tablet, Rfl: 5   levothyroxine (SYNTHROID) 100 MCG tablet, Take 100 mcg by mouth daily., Disp: , Rfl:    LINZESS 72 MCG capsule, Take 72 mcg by mouth every morning., Disp: , Rfl:    Multiple Vitamins-Minerals (HAIR SKIN & NAILS PO), Take 1 tablet by mouth daily., Disp: , Rfl:    oxybutynin  (DITROPAN ) 5 MG tablet, TAKE 1 TABLET(5 MG) BY MOUTH TWICE DAILY, Disp: 180 tablet, Rfl: 2   psyllium (METAMUCIL) 58.6 % powder, Take 1 packet by mouth 3 (three) times daily., Disp: , Rfl:    sertraline  (ZOLOFT ) 100 MG tablet, TAKE 1 TABLET(100 MG) BY MOUTH DAILY, Disp: 90 tablet, Rfl: 3   UNABLE TO FIND, Med Name: Eats 4 prunes per day to help keep BM regular,  Disp: , Rfl:    Cholecalciferol 25 MCG (1000 UT) tablet, Take 1,000 Units by mouth daily. (Patient not taking: Reported on 02/20/2024), Disp: , Rfl:    diphenhydramine-acetaminophen  (TYLENOL  PM) 25-500 MG TABS tablet, Take 1 tablet by mouth at bedtime. (Patient not taking: Reported on 02/20/2024), Disp: , Rfl:    Fingolimod  HCl 0.5 MG CAPS, Take 1 capsule (0.5 mg total) by mouth every other day., Disp: 45 capsule, Rfl: 4   lamoTRIgine  (LAMICTAL ) 150 MG tablet, Take 1 tablet (150 mg total) by mouth 2 (two) times daily., Disp: 180 tablet, Rfl: 3   NUEDEXTA  20-10 MG capsule, Take 1 capsule by mouth 2 (two) times daily., Disp: 180 capsule, Rfl: 3   OLANZapine  (ZYPREXA ) 5 MG tablet, One po qd, Disp: 90 tablet, Rfl: 4   VITAMIN E PO, Take 1 capsule by mouth in the morning. (Patient not taking: Reported on  02/20/2024), Disp: , Rfl:   PAST MEDICAL HISTORY: Past Medical History:  Diagnosis Date   Depressed    Hyperlipemia    Movement disorder    MS (multiple sclerosis) (HCC)    Vision abnormalities     PAST SURGICAL HISTORY: Past Surgical History:  Procedure Laterality Date   ABDOMINAL HYSTERECTOMY     TOTAL HIP ARTHROPLASTY Right 11/05/2021    FAMILY HISTORY: History reviewed. No pertinent family history.  SOCIAL HISTORY:  Social History   Socioeconomic History   Marital status: Married    Spouse name: Not on file   Number of children: Not on file   Years of education: Not on file   Highest education level: Not on file  Occupational History   Not on file  Tobacco Use   Smoking status: Never   Smokeless tobacco: Never  Substance and Sexual Activity   Alcohol use: No   Drug use: No   Sexual activity: Never    Birth control/protection: Surgical  Other Topics Concern   Not on file  Social History Narrative   Lives with husband, Optometrist   Social Drivers of Health   Financial Resource Strain: Not on file  Food Insecurity: Not on file  Transportation Needs: Not on file  Physical  Activity: Not on file  Stress: Not on file  Social Connections: Not on file  Intimate Partner Violence: Not on file     PHYSICAL EXAM  Vitals:   02/20/24 1511  BP: 116/78  Pulse: 65  Weight: 131 lb 8 oz (59.6 kg)  Height: 5\' 5"  (1.651 m)    Body mass index is 21.88 kg/m.   General: The patient is well-developed and well-nourished and in no acute distress.     Skin: Extremities are without rash or significant edema.   Neurologic Exam  Mental status: The patient is alert and oriented x 2 at the time of the examination.  She has reduced focus and attention.  She has mildly reduced memory and attention, similar to previous visits.  Speech is normal except some word finding pauses.  No hallucinations on exam.  Cranial nerves: Extraocular movements are full.   Mildly reduced color saturation OS.   Facial strength and sensation is normal.  Trapezius strength is normal   No obvious hearing deficits are noted.  Motor:  Muscle bulk is normal.   Muscle tone is increased in the legs, right greater than left.  Strength is 5/5 in the arms and 4+-5/5 left leg and 4+ right leg  Sensory: She had reduced vibration sensation in the legs relative to the arms.  Touch sensation was more normal  Coordination: Finger-nose-finger is performed fairly normally but reduced heel to shin, worse on right.  .   Gait and station: Station is unbalanced but she can stand without support.  She can take steps but is very ataxic.  She has a right foot drop.  She is unable to do tandem gait. Romberg is borderline.   Reflexes: Deep tendon reflexes are normal in the arms and increased in the legs.      ASSESSMENT AND PLAN    1. Multiple sclerosis (HCC)   2. High risk medication use   3. Abnormal gait   4. Hallucination   5. Cognitive deficit secondary to multiple sclerosis (HCC)   6. History of subdural hematoma   7. Pseudobulbar affect   8. Depression with anxiety        1.   She will continue  Gilenya   every other day for her active SPMS.  Will change from 4 times a week to three tines a week.  Check labs.  If the lymphocytes drop further we would need to consider different disease modifying therapy..   2.   Continue Zyprexa ,  buspirone , sertraline  and lamotrigine  and Nuedexta .  She should take 1 a day but can take an additional pill if she has one of her episodes of more agitation.  Continue other medications including.   Prescriptions were refilled. 3.   Continue Linzess for bowel regimen. Per PCP    4.   Return in 6 months or sooner if there are new or worsening neurologic symptoms.     This visit is part of a comprehensive longitudinal care medical relationship regarding the patients primary diagnosis of MS and related concerns.   Siniya Lichty A. Godwin Lat, MD, PhD 02/20/2024, 3:38 PM Certified in Neurology, Clinical Neurophysiology, Sleep Medicine, Pain Medicine and Neuroimaging  [facility] [address], [address] [place], [place] [address] [phone]

## 2024-02-21 ENCOUNTER — Ambulatory Visit: Payer: Self-pay | Admitting: Neurology

## 2024-02-21 ENCOUNTER — Telehealth: Payer: Self-pay

## 2024-02-21 ENCOUNTER — Other Ambulatory Visit (HOSPITAL_COMMUNITY): Payer: Self-pay

## 2024-02-21 ENCOUNTER — Telehealth: Payer: Self-pay | Admitting: Neurology

## 2024-02-21 LAB — CBC WITH DIFFERENTIAL/PLATELET
Basophils Absolute: 0 10*3/uL (ref 0.0–0.2)
Basos: 0 %
EOS (ABSOLUTE): 0 10*3/uL (ref 0.0–0.4)
Eos: 0 %
Hematocrit: 38.6 % (ref 34.0–46.6)
Hemoglobin: 12.8 g/dL (ref 11.1–15.9)
Immature Grans (Abs): 0 10*3/uL (ref 0.0–0.1)
Immature Granulocytes: 1 %
Lymphocytes Absolute: 0.4 10*3/uL — ABNORMAL LOW (ref 0.7–3.1)
Lymphs: 9 %
MCH: 31.8 pg (ref 26.6–33.0)
MCHC: 33.2 g/dL (ref 31.5–35.7)
MCV: 96 fL (ref 79–97)
Monocytes Absolute: 0.6 10*3/uL (ref 0.1–0.9)
Monocytes: 13 %
Neutrophils Absolute: 3.5 10*3/uL (ref 1.4–7.0)
Neutrophils: 77 %
Platelets: 319 10*3/uL (ref 150–450)
RBC: 4.02 x10E6/uL (ref 3.77–5.28)
RDW: 13 % (ref 11.7–15.4)
WBC: 4.5 10*3/uL (ref 3.4–10.8)

## 2024-02-21 LAB — COMPREHENSIVE METABOLIC PANEL WITH GFR
ALT: 24 IU/L (ref 0–32)
AST: 24 IU/L (ref 0–40)
Albumin: 4.6 g/dL (ref 3.9–4.9)
Alkaline Phosphatase: 75 IU/L (ref 44–121)
BUN/Creatinine Ratio: 29 — ABNORMAL HIGH (ref 12–28)
BUN: 24 mg/dL (ref 8–27)
Bilirubin Total: 0.2 mg/dL (ref 0.0–1.2)
CO2: 23 mmol/L (ref 20–29)
Calcium: 9.4 mg/dL (ref 8.7–10.3)
Chloride: 95 mmol/L — ABNORMAL LOW (ref 96–106)
Creatinine, Ser: 0.84 mg/dL (ref 0.57–1.00)
Globulin, Total: 2.3 g/dL (ref 1.5–4.5)
Glucose: 77 mg/dL (ref 70–99)
Potassium: 5.2 mmol/L (ref 3.5–5.2)
Sodium: 136 mmol/L (ref 134–144)
Total Protein: 6.9 g/dL (ref 6.0–8.5)
eGFR: 77 mL/min/{1.73_m2} (ref >59)

## 2024-02-21 NOTE — Telephone Encounter (Signed)
 Pharmacy Patient Advocate Encounter  Received notification from [employer] that Prior Authorization for Fingolimod  HCl 0.5MG  capsules has been APPROVED from 01/22/2024 to 02/20/2025   PA #/Case ID/Reference #: PA Case ID #: [id number]

## 2024-02-21 NOTE — Telephone Encounter (Signed)
 Pharmacy Patient Advocate Encounter   Received notification from CoverMyMeds that prior authorization for Fingolimod  HCl 0.5MG  capsules is required/requested.   Insurance verification completed.   The patient is insured through [employer] .   Per test claim: PA required; PA submitted to above mentioned insurance via CoverMyMeds Key/confirmation #/EOC [id number] Status is pending

## 2024-02-21 NOTE — Telephone Encounter (Signed)
 Pt dropped of FMLA paper work to be filled out paid $50 fee.

## 2024-02-23 ENCOUNTER — Other Ambulatory Visit: Payer: Self-pay | Admitting: Neurology

## 2024-02-24 DIAGNOSIS — Z0289 Encounter for other administrative examinations: Secondary | ICD-10-CM

## 2024-02-25 ENCOUNTER — Telehealth: Payer: Self-pay | Admitting: *Deleted

## 2024-02-25 NOTE — Telephone Encounter (Signed)
 Sent completed FMLA paperwork to medical records to process for pt.

## 2024-02-25 NOTE — Telephone Encounter (Signed)
   It appears pharmacy is requesting a 30 day supply and not the 90 day that was sent in.

## 2024-02-25 NOTE — Telephone Encounter (Signed)
 I called Pt not able to leave a message. Pt form is ready for p/u @ the front desk.

## 2024-03-30 ENCOUNTER — Other Ambulatory Visit: Payer: Self-pay | Admitting: Neurology

## 2024-03-30 ENCOUNTER — Telehealth: Payer: Self-pay | Admitting: Neurology

## 2024-03-30 ENCOUNTER — Encounter: Payer: Self-pay | Admitting: Neurology

## 2024-03-30 DIAGNOSIS — G35 Multiple sclerosis: Secondary | ICD-10-CM

## 2024-03-30 MED ORDER — FINGOLIMOD HCL 0.5 MG PO CAPS: 1.0000 | ORAL_CAPSULE | ORAL | 4 refills | Status: AC

## 2024-03-30 NOTE — Telephone Encounter (Signed)
 Pt called to informed MD that a PA is needed for Pt  to receive medication  Gallium

## 2024-03-30 NOTE — Telephone Encounter (Signed)
 Last seen on 02/20/24 Follow up scheduled on 10/01/24

## 2024-03-30 NOTE — Telephone Encounter (Signed)
 Called to confirm which med needed PA. States medication: Gilenya . Rx sent to [facility] by accident. Correct pharmacy: [facility]. Has about 1-2 wk left. Relayed below approval info. They asked we resend rx to [facility] with approval info. I resent as requested. They will f/u with [facility] tomorrow. If they need anything else, they will call back.

## 2024-05-29 ENCOUNTER — Other Ambulatory Visit: Payer: Self-pay | Admitting: Neurology

## 2024-05-29 ENCOUNTER — Other Ambulatory Visit: Payer: Self-pay

## 2024-07-02 ENCOUNTER — Encounter: Payer: Self-pay | Admitting: Neurology

## 2024-07-02 ENCOUNTER — Other Ambulatory Visit: Payer: Self-pay | Admitting: Neurology

## 2024-07-02 ENCOUNTER — Other Ambulatory Visit: Payer: Self-pay

## 2024-08-28 ENCOUNTER — Other Ambulatory Visit: Payer: Self-pay | Admitting: Neurology

## 2024-09-30 ENCOUNTER — Telehealth: Payer: Self-pay | Admitting: Neurology

## 2024-09-30 NOTE — Telephone Encounter (Signed)
 Appointment details confirmed

## 2024-10-01 ENCOUNTER — Ambulatory Visit (INDEPENDENT_AMBULATORY_CARE_PROVIDER_SITE_OTHER): Admitting: Neurology

## 2024-10-01 ENCOUNTER — Encounter: Payer: Self-pay | Admitting: Neurology

## 2024-10-01 VITALS — BP 121/75 | HR 66 | Resp 15 | Ht 65.0 in | Wt 132.0 lb

## 2024-10-01 DIAGNOSIS — G35C1 Active secondary progressive multiple sclerosis: Secondary | ICD-10-CM | POA: Diagnosis not present

## 2024-10-01 DIAGNOSIS — R41840 Attention and concentration deficit: Secondary | ICD-10-CM

## 2024-10-01 DIAGNOSIS — R443 Hallucinations, unspecified: Secondary | ICD-10-CM | POA: Diagnosis not present

## 2024-10-01 DIAGNOSIS — R269 Unspecified abnormalities of gait and mobility: Secondary | ICD-10-CM

## 2024-10-01 DIAGNOSIS — F09 Unspecified mental disorder due to known physiological condition: Secondary | ICD-10-CM | POA: Diagnosis not present

## 2024-10-01 DIAGNOSIS — F482 Pseudobulbar affect: Secondary | ICD-10-CM

## 2024-10-01 DIAGNOSIS — F418 Other specified anxiety disorders: Secondary | ICD-10-CM

## 2024-10-01 DIAGNOSIS — G44229 Chronic tension-type headache, not intractable: Secondary | ICD-10-CM

## 2024-10-01 DIAGNOSIS — Z79899 Other long term (current) drug therapy: Secondary | ICD-10-CM

## 2024-10-01 DIAGNOSIS — G35D Multiple sclerosis, unspecified: Secondary | ICD-10-CM

## 2024-10-01 MED ORDER — BETAMETHASONE SOD PHOS & ACET 6 (3-3) MG/ML IJ SUSP
12.0000 mg | Freq: Once | INTRAMUSCULAR | Status: AC
  Administered 2024-10-01: 12 mg via INTRAMUSCULAR

## 2024-10-01 MED ORDER — OLANZAPINE 2.5 MG PO TABS: ORAL_TABLET | ORAL | 3 refills | Status: AC

## 2024-10-01 NOTE — Progress Notes (Signed)
 "  [facility]  PATIENT: Dawn Keith DOB: 1961-07-22  REFERRING DOCTOR OR PCP:  Arland Sniff, PA-C SOURCE: patient  _________________________________   HISTORICAL  CHIEF COMPLAINT:  Chief Complaint  Patient presents with   Multiple Sclerosis    Rm10, friend present, Ms follow up     HISTORY OF PRESENT ILLNESS:  Dawn Keith is a 64 y.o. woman with MS.   Update 10/01/2024: She is on Gilenya  4 times a week.  The lymphocyte count was 0.01 June 2024 (had been 0.3 on daily Gilenya ).  The MRIs of the brain and cervical spine performed in 02/2023 was stable compared to 2022.  Images were reviewed..     Clinically, she has no exacerbations but has had progression with some worsening of gait and cognition  She has more occipital HA.   In 2024, an occipital nerve block helped for many months     Gait is poor  and she falls about once a month - last one in November.  She has been alone most of these times - often getting up from the toilet.   Less likely to fall when anyone with her as she does not try to get up without someone near her.     She has spasticity and weakness in both legs and mild weakness in the arms.   Also has ataxia.  She has a walker or other people for balance.    Her right side feels weaker than left.   She had an SDH in 2022 after a fall.   Denies numbness or dysesthesia.   She uses a walker and can go 100 feet and sometimes more.       Bladder is doing better with oxybutynin .  She has no recent incontinence and not needing Pull-ups or pads recently.  She is on oxybutynn    She often feels constipated and also feels she is not wiping well.. Better with Linzess.    She is not having severe delusions or paranoia.  She does feel people are going to send her to a nursing home.   She is on lamotrigine , buspar ,Zyprexa  and Nuedexta  and sertraline .    She has sleep onset and maintenance insomnia.    She has anxiety and depression.  She also has had pseudobulbar  affect.  She rarely has delusions now since medications were modified.  She has decreased memory and other cognition.   Her friend notes this is worse..   She also reports fatigue.      She picks at her face a lot and has had infections and bleeding.   She will sometimes eat excessively and then purposely throw up.    She then eats again.   She actually has a h/o bulimia and laxative abuse even before olanzepine was used.      MS history:   She was diagnosed with MS in 2012 after several years of worsening gait.    She started on Gilenya  and has tolerated it well and has had no defiinite exacerbation but has had progressive gait disturbance and cognitive issues.    IMAGING: MRI of the cervical spine 10/09/2018 without contrast shows multiple T2 hyperintense foci within the spinal cord as detailed above.  Compared to the 2012 MRI, none of these appear to be new.  They are consistent with chronic demyelinating plaque associated with multiple sclerosis.  Also has multilevel degenerative changes as detailed above that does not lead to spinal stenosis or nerve root compression.  There has been mild progression of the degenerative changes at C4-C5 and C5-C6.  MRI of the brain 10/09/2018 without contrast shows multiple T2/flair hyperintense foci in the hemispheres, brainstem and spinal cord in a pattern and configuration consistent with chronic demyelinating plaque associated with multiple sclerosis.  None of the foci appears to be acute.     Moderate generalized cortical atrophy.      Compared to the MRI dated 08/20/2011, there are no definite new lesions.  The left middle cerebellar peduncle lesion is smaller on the current study.  The generalized cortical atrophy has slightly progressed.  MRI brain 03/13/2023 showed Multiple T2/FLAIR hyperintense foci in the cerebellum in a pattern consistent with chronic demyelinating plaque associated with multiple sclerosis.  There does not appear to be any new lesion.  No  definite change compared to the MRI from 12/18/2021.  Hygroma overlying the right frontal convexity with a maximal diameter of 7 mm, stable compared to the previous MRI.   Generalized cortical atrophy.    REVIEW OF SYSTEMS: Constitutional: No fevers, chills, sweats, or change in appetite.  Notes fatigue Eyes: No visual changes, double vision, eye pain Ear, nose and throat: No hearing loss, ear pain, nasal congestion, sore throat Cardiovascular: No chest pain, palpitations Respiratory:  No shortness of breath at rest or with exertion.   No wheezes.   Pneumonia in 2017 GastrointestinaI: No nausea, vomiting, diarrhea, abdominal pain, fecal incontinence Genitourinary:  as above Musculoskeletal:  No neck pain, back pain Integumentary: No rash, pruritus, skin lesions Neurological: as above Psychiatric: as above Endocrine: No palpitations, diaphoresis, change in appetite, change in weigh or increased thirst Hematologic/Lymphatic:  No anemia, purpura, petechiae. Allergic/Immunologic: No itchy/runny eyes, nasal congestion, recent allergic reactions, rashes  ALLERGIES: Allergies  Allergen Reactions   Donepezil Dermatitis    makes me mean  makes mean   Latex Dermatitis    HOME MEDICATIONS:  Current Outpatient Medications:    acetaminophen  (TYLENOL ) 500 MG tablet, Take 2 tablets by mouth every morning. Alternates with ibup.  Gives 4 tylenol  and 4 ibuprofen as needed, Disp: , Rfl:    busPIRone  (BUSPAR ) 15 MG tablet, TAKE 1 TABLET(15 MG) BY MOUTH TWICE DAILY, Disp: 180 tablet, Rfl: 3   Cyanocobalamin (VITAMIN B-12) 2500 MCG TABS, Take 2,500 mcg by mouth daily., Disp: 30 tablet, Rfl: 5   Fingolimod  HCl 0.5 MG CAPS, Take 1 capsule (0.5 mg total) by mouth every other day., Disp: 45 capsule, Rfl: 4   ibuprofen (ADVIL) 200 MG tablet, Take 200 mg by mouth every 6 (six) hours as needed., Disp: , Rfl:    lamoTRIgine  (LAMICTAL ) 150 MG tablet, Take 1 tablet (150 mg total) by mouth 2 (two) times  daily., Disp: 180 tablet, Rfl: 3   levothyroxine (SYNTHROID) 100 MCG tablet, Take 100 mcg by mouth daily., Disp: , Rfl:    LINZESS 72 MCG capsule, Take 72 mcg by mouth every morning., Disp: , Rfl:    Multiple Vitamins-Minerals (HAIR SKIN & NAILS PO), Take 1 tablet by mouth daily., Disp: , Rfl:    NUEDEXTA  20-10 MG capsule, TAKE 1 CAPSULE BY MOUTH TWICE DAILY, Disp: 60 capsule, Rfl: 7   oxybutynin  (DITROPAN ) 5 MG tablet, TAKE 1 TABLET(5 MG) BY MOUTH TWICE DAILY, Disp: 180 tablet, Rfl: 2   psyllium (METAMUCIL) 58.6 % powder, Take 1 packet by mouth 3 (three) times daily., Disp: , Rfl:    sertraline  (ZOLOFT ) 100 MG tablet, TAKE 1 TABLET(100 MG) BY MOUTH DAILY, Disp: 90 tablet, Rfl: 3  OLANZapine  (ZYPREXA ) 2.5 MG tablet, One po qd, Disp: 90 tablet, Rfl: 3  Current Facility-Administered Medications:    betamethasone  acetate-betamethasone  sodium phosphate (CELESTONE ) injection 12 mg, 12 mg, Intramuscular, Once,   PAST MEDICAL HISTORY: Past Medical History:  Diagnosis Date   Depressed    Hyperlipemia    Movement disorder    MS (multiple sclerosis)    Vision abnormalities     PAST SURGICAL HISTORY: Past Surgical History:  Procedure Laterality Date   ABDOMINAL HYSTERECTOMY     TOTAL HIP ARTHROPLASTY Right 11/05/2021    FAMILY HISTORY: History reviewed. No pertinent family history.  SOCIAL HISTORY:  Social History   Socioeconomic History   Marital status: Married    Spouse name: Not on file   Number of children: Not on file   Years of education: Not on file   Highest education level: Not on file  Occupational History   Not on file  Tobacco Use   Smoking status: Never   Smokeless tobacco: Never  Substance and Sexual Activity   Alcohol use: No   Drug use: No   Sexual activity: Never    Birth control/protection: Surgical  Other Topics Concern   Not on file  Social History Narrative   Lives with husband, Zell   Social Drivers of Health   Tobacco Use: Low Risk (10/01/2024)    Patient History    Smoking Tobacco Use: Never    Smokeless Tobacco Use: Never    Passive Exposure: Not on file  Financial Resource Strain: Not on file  Food Insecurity: Not on file  Transportation Needs: Not on file  Physical Activity: Not on file  Stress: Not on file  Social Connections: Not on file  Intimate Partner Violence: Not on file  Depression (EYV7-0): Not on file  Alcohol Screen: Not on file  Housing: Not on file  Utilities: Not on file  Health Literacy: Not on file     PHYSICAL EXAM  Vitals:   10/01/24 1527  BP: 121/75  Pulse: 66  Resp: 15  SpO2: 96%  Weight: 132 lb (59.9 kg)  Height: 5' 5 (1.651 m)    Body mass index is 21.97 kg/m.   General: The patient is well-developed and well-nourished and in no acute distress.  The neck has a good range of motion.  There is tenderness over the splenius capitis muscles bilaterally.  Skin/: Extremities are without rash or significant edema.   Neurologic Exam  Mental status: The patient is alert and oriented x 2 at the time of the examination.  She has reduced focus and attention.  She has mildly reduced memory and attention, similar to previous visits.  Speech is normal except some word finding pauses.  No hallucinations on exam.  Cranial nerves: Extraocular movements are full.   Mildly reduced color saturation OS.   Facial strength and sensation is normal.  Trapezius strength is normal   No obvious hearing deficits are noted.  Motor:  Muscle bulk is normal.   Muscle tone is increased in the legs, right greater than left.  Strength is 5/5 in the arms and 4+-5/5 left leg and 4+ right leg  Sensory: She had reduced vibration sensation in the legs relative to the arms.  Touch sensation was more normal  Coordination: Finger-nose-finger is performed fairly normally but reduced heel to shin, worse on right.  .   Gait and station: Station is unbalanced but she can stand without support.  She can take steps but is very  ataxic.  She has a right foot drop.  She is unable to do tandem gait. Romberg is borderline.   Reflexes: Deep tendon reflexes are normal in the arms and increased in the legs.      ASSESSMENT AND PLAN    1. Active secondary progressive multiple sclerosis   2. Chronic tension-type headache, not intractable   3. 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 Abnormal gait   5. Attention deficit   6. Depression with anxiety   7. Hallucination   8. High risk medication use   9. Pseudobulbar affect       1.   She will continue Gilenya  every other day for her active SPMS.  She is on a reduced frequency of dose due to very low lymphocytes in the past.  Current lymphocyte count is just minimally reduced. 2.   Continue Zyprexa  but cut dose to 2.5 mg (due to hyperphagia).  Continue buspirone , sertraline  and lamotrigine  and Nuedexta .   Prescriptions were refilled. 3.   Continue Linzess for bowel regimen. Per PCP    4.   Trigger point injection of bilateral splenius capitis muscles with 12 mg Celestone  and 3 cc Marcaine using sterile technique.  She tolerated procedure well and there were no complications.  Pain was better afterwards.    Return in 6 months or sooner if there are new or worsening neurologic symptoms.    This visit is part of a comprehensive longitudinal care medical relationship regarding the patients primary diagnosis of MS and related concerns.   Thersea Manfredonia A. Vear, MD, PhD 10/01/2024, 8:11 PM Certified in Neurology, Clinical Neurophysiology, Sleep Medicine, Pain Medicine and Neuroimaging  [facility] [address] [place], [place] [address] [phone] "

## 2025-06-02 ENCOUNTER — Ambulatory Visit: Admitting: Neurology
